# Patient Record
Sex: Male | Born: 1995 | Race: Black or African American | Hispanic: No | Marital: Single | Smoking: Never smoker
Health system: Southern US, Community
[De-identification: ages and names within clinical notes are randomized; demographics above are authoritative.]

---
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
## 2016-10-05 ENCOUNTER — Encounter (HOSPITAL_COMMUNITY): Payer: Self-pay | Admitting: *Deleted

## 2016-10-05 ENCOUNTER — Emergency Department (HOSPITAL_COMMUNITY)
Admission: EM | Admit: 2016-10-05 | Discharge: 2016-10-05 | Disposition: A | Discharge status: Home / Self Care | Payer: Medicaid Other | Attending: Emergency Medicine | Admitting: Emergency Medicine

## 2016-10-05 DIAGNOSIS — Z5321 Procedure and treatment not carried out due to patient leaving prior to being seen by health care provider: Secondary | ICD-10-CM | POA: Insufficient documentation

## 2016-10-05 DIAGNOSIS — R55 Syncope and collapse: Secondary | ICD-10-CM | POA: Insufficient documentation

## 2016-10-05 LAB — BASIC METABOLIC PANEL
ANION GAP: 7 (ref 5–15)
BUN: 19 mg/dL (ref 6–20)
CALCIUM: 9 mg/dL (ref 8.9–10.3)
CO2: 26 mmol/L (ref 22–32)
Chloride: 107 mmol/L (ref 101–111)
Creatinine, Ser: 1.12 mg/dL (ref 0.61–1.24)
GLUCOSE: 126 mg/dL — AB (ref 65–99)
POTASSIUM: 3.4 mmol/L — AB (ref 3.5–5.1)
SODIUM: 140 mmol/L (ref 135–145)

## 2016-10-05 LAB — CBC
HEMATOCRIT: 39.3 % (ref 39.0–52.0)
HEMOGLOBIN: 13.4 g/dL (ref 13.0–17.0)
MCH: 32.8 pg (ref 26.0–34.0)
MCHC: 34.1 g/dL (ref 30.0–36.0)
MCV: 96.3 fL (ref 78.0–100.0)
Platelets: 261 10*3/uL (ref 150–400)
RBC: 4.08 MIL/uL — ABNORMAL LOW (ref 4.22–5.81)
RDW: 11.8 % (ref 11.5–15.5)
WBC: 7.2 10*3/uL (ref 4.0–10.5)

## 2016-10-05 NOTE — ED Notes (Signed)
Pts mother reports pt is leaving, this RN encouraged the pt to stay, pt requesting to be removed
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
## 2017-05-20 ENCOUNTER — Emergency Department (HOSPITAL_COMMUNITY)
Admission: EM | Admit: 2017-05-20 | Discharge: 2017-05-20 | Disposition: A | Discharge status: Home / Self Care | Payer: Self-pay | Attending: Emergency Medicine | Admitting: Emergency Medicine

## 2017-05-20 ENCOUNTER — Other Ambulatory Visit: Payer: Self-pay

## 2017-05-20 ENCOUNTER — Encounter (HOSPITAL_COMMUNITY): Payer: Self-pay | Admitting: Emergency Medicine

## 2017-05-20 DIAGNOSIS — H00015 Hordeolum externum left lower eyelid: Secondary | ICD-10-CM | POA: Insufficient documentation

## 2017-05-20 NOTE — Discharge Instructions (Signed)
You were seen in the emergency department for a stye to your left lower leg.  Please see the attached handout for further information regarding this diagnosis.  Apply warm compresses to the lower lid for 15 minutes 4 times per day.  You may gently massage this area after warm compress application.  Be sure to wash your hands frequently.  This should resolve on its own in the next 1-2 weeks.  If this is not resolved in the next 10 days you will need to follow-up with an ophthalmologist.  We have given you information for ophthalmologist to your discharge instructions.  Return to the ER for any new or worsening symptoms including but not limited to change in your vision, pus like drainage from the eye, difficulty moving her eye, swelling/redness around the eye, fever, or any other concerns that you may have.

## 2017-05-20 NOTE — ED Triage Notes (Signed)
Pt with complaints of stye to left eye. States he had one in his right eye recently.

## 2017-05-20 NOTE — ED Provider Notes (Signed)
[facility] EMERGENCY DEPARTMENT Provider Note   CSN: [id number] Arrival date & time: 05/20/17  1744     History   Chief Complaint Chief Complaint  Patient presents with  . Stye    HPI Jeremy Zhang is a 22 y.o. male without significant past medical history who presents the emergency department with complaint of stye to the left lower lid of his eye that started 2 days ago.  Patient states that the area is somewhat uncomfortable and irritating.  Rates discomfort an 8 out of 10 in severity, no alleviating or aggravating factors.  Specifically states the area of pain is to the lid not to the eye.  He has been applying warm compresses.  Denies change in vision or eye drainage, fever, or chills.  HPI  History reviewed. No pertinent past medical history.  There are no active problems to display for this patient.   History reviewed. No pertinent surgical history.      Home Medications    Prior to Admission medications   Medication Sig Start Date End Date Taking? Authorizing Provider  cyclobenzaprine (FLEXERIL) 5 MG tablet Take 1 tablet (5 mg total) by mouth once. Patient not taking: Reported on 01/26/2014 10/05/13   Niel Hummer, MD  diphenhydrAMINE (BENADRYL) 25 mg capsule Take 1 capsule (25 mg total) by mouth every 8 (eight) hours as needed for itching or allergies. 01/26/14   Earley Favor, NP    Family History History reviewed. No pertinent family history.  Social History Social History   Tobacco Use  . Smoking status: Never Smoker  Substance Use Topics  . Alcohol use: No  . Drug use: Not on file     Allergies   Patient has no known allergies.   Review of Systems Review of Systems  Constitutional: Negative for chills and fever.  Eyes: Negative for photophobia, pain, discharge and visual disturbance.       Positive for stye to left lower lid.   Physical Exam Updated Vital Signs BP 114/70   Pulse (!) 54   Temp 98 F (36.7 C) (Oral)    Resp 16   Ht 5\' 8"  (1.727 m)   Wt 56.7 kg (125 lb)   SpO2 100%   BMI 19.01 kg/m   Physical Exam  Constitutional: He appears well-developed and well-nourished. No distress.  HENT:  Head: Normocephalic and atraumatic.  Eyes: Pupils are equal, round, and reactive to light. Conjunctivae and EOM are normal. Lids are everted and swept, no foreign bodies found. Right eye exhibits no discharge. Left eye exhibits no discharge.    No periorbital swelling or erythema.  No overlying warmth.  Neurological: He is alert.  Clear speech.   Psychiatric: He has a normal mood and affect. His behavior is normal. Thought content normal.  Nursing note and vitals reviewed.  ED Treatments / Results  Labs (all labs ordered are listed, but only abnormal results are displayed) Labs Reviewed - No data to display  EKG None  Radiology No results found.  Procedures Procedures (including critical care time)  Medications Ordered in ED Medications - No data to display   Initial Impression / Assessment and Plan / ED Course  I have reviewed the triage vital signs and the nursing notes.  Pertinent labs & imaging results that were available during my care of the patient were reviewed by me and considered in my medical decision making (see chart for details).  Patient presents with left lower lid area of erythema, tenderness,  and mild swelling without injection of the cornea/conjunctiva consistent with external stye.  No purulent discharge entrapment, consensual photophobia, or periorbital swelling/erythema to indicate orbital/periorbital cellulitis.  Given onset of symptoms 2 days prior will recommend warm compresses, personal hygiene and frequent handwashing also discussed.  Patient advised to followup with ophthalmologist if sxs have not resolved in 1-2 weeks. Given strict ED return precautions. Provided opportunity for questions, patient in agreement with plan.   Final Clinical Impressions(s) / ED Diagnoses    Final diagnoses:  Hordeolum externum of left lower eyelid    ED Discharge Orders    None       Cherly Andersonetrucelli, Georgianna Band R, [name] 05/20/17 1956    Margarita Grizzleay, Danielle, MD 05/21/17 1431

## 2018-02-25 ENCOUNTER — Emergency Department (HOSPITAL_COMMUNITY)
Admission: EM | Admit: 2018-02-25 | Discharge: 2018-02-25 | Disposition: A | Discharge status: Home / Self Care | Payer: Self-pay | Attending: Emergency Medicine | Admitting: Emergency Medicine

## 2018-02-25 ENCOUNTER — Encounter (HOSPITAL_COMMUNITY): Payer: Self-pay

## 2018-02-25 ENCOUNTER — Other Ambulatory Visit: Payer: Self-pay

## 2018-02-25 DIAGNOSIS — M545 Low back pain, unspecified: Secondary | ICD-10-CM

## 2018-02-25 MED ORDER — LIDOCAINE 5 % EX PTCH: 1.0000 | MEDICATED_PATCH | CUTANEOUS | 0 refills | Status: DC

## 2018-02-25 MED ORDER — IBUPROFEN 600 MG PO TABS: 600.0000 mg | ORAL_TABLET | Freq: Four times a day (QID) | ORAL | 0 refills | Status: DC | PRN

## 2018-02-25 MED ORDER — METHOCARBAMOL 500 MG PO TABS: 500.0000 mg | ORAL_TABLET | Freq: Two times a day (BID) | ORAL | 0 refills | Status: DC

## 2018-02-25 NOTE — ED Provider Notes (Signed)
[facility] EMERGENCY DEPARTMENT Provider Note   CSN: [id number] Arrival date & time: 02/25/18  1450     History   Chief Complaint Chief Complaint  Patient presents with  . Back Pain    HPI Jeremy Zhang is a 23 y.o. male.  HPI   Jeremy Zhang is a 23 y.o. male, with a history of back pain, presenting to the ED with left lower back pain beginning today.  States he has had intermittent lower back pain since his teens.  Today while at work he lifted and twisted with pain beginning in the left lower back, described as a soreness and tightness, moderate, nonradiating.  He has not tried any therapies for his complaint.  Denies falls/trauma, N/V/D, changes in bowel or bladder function, numbness, weakness, saddle anesthesias, or any other complaints.  History reviewed. No pertinent past medical history.  There are no active problems to display for this patient.   History reviewed. No pertinent surgical history.      Home Medications    Prior to Admission medications   Medication Sig Start Date End Date Taking? Authorizing Provider  diphenhydrAMINE (BENADRYL) 25 mg capsule Take 1 capsule (25 mg total) by mouth every 8 (eight) hours as needed for itching or allergies. 01/26/14   Earley Favor, NP  ibuprofen (ADVIL,MOTRIN) 600 MG tablet Take 1 tablet (600 mg total) by mouth every 6 (six) hours as needed. 02/25/18   Joy, Shawn C, [name]  lidocaine (LIDODERM) 5 % Place 1 patch onto the skin daily. Remove & Discard patch within 12 hours or as directed by MD 02/25/18   Joy, Shawn C, [name]  methocarbamol (ROBAXIN) 500 MG tablet Take 1 tablet (500 mg total) by mouth 2 (two) times daily. 02/25/18   Anselm Pancoast, [name]    Family History History reviewed. No pertinent family history.  Social History Social History   Tobacco Use  . Smoking status: Never Smoker  . Smokeless tobacco: Never Used  Substance Use Topics  . Alcohol use: No  . Drug use: Not on file      Allergies   Patient has no known allergies.   Review of Systems Review of Systems  Gastrointestinal: Negative for abdominal pain, diarrhea, nausea and vomiting.  Genitourinary: Negative for difficulty urinating.  Musculoskeletal: Positive for back pain.  Neurological: Negative for weakness and numbness.     Physical Exam Updated Vital Signs BP 132/77   Pulse (!) 56   Temp 98.6 F (37 C) (Oral)   Resp 16   Ht 5\' 9"  (1.753 m)   Wt 56.7 kg   SpO2 100%   BMI 18.46 kg/m   Physical Exam Vitals signs and nursing note reviewed.  Constitutional:      General: He is not in acute distress.    Appearance: He is well-developed. He is not diaphoretic.  HENT:     Head: Normocephalic and atraumatic.     Mouth/Throat:     Mouth: Mucous membranes are moist.     Pharynx: Oropharynx is clear.  Eyes:     Conjunctiva/sclera: Conjunctivae normal.  Neck:     Musculoskeletal: Neck supple.  Cardiovascular:     Rate and Rhythm: Normal rate and regular rhythm.     Pulses: Normal pulses.          Posterior tibial pulses are 2+ on the right side and 2+ on the left side.  Pulmonary:     Effort: Pulmonary effort is normal. No respiratory distress.  Abdominal:     Palpations: Abdomen is soft.     Tenderness: There is no abdominal tenderness. There is no guarding.  Musculoskeletal:       Back:     Right lower leg: No edema.     Left lower leg: No edema.     Comments: Normal motor function intact in all extremities. No midline spinal tenderness.   Lymphadenopathy:     Cervical: No cervical adenopathy.  Skin:    General: Skin is warm and dry.  Neurological:     Mental Status: He is alert.     Comments: Sensation grossly intact to light touch in the lower extremities bilaterally. No saddle anesthesias. Strength 5/5 in the bilateral lower extremities. No noted gait deficit.  Psychiatric:        Mood and Affect: Mood and affect normal.        Speech: Speech normal.         Behavior: Behavior normal.      ED Treatments / Results  Labs (all labs ordered are listed, but only abnormal results are displayed) Labs Reviewed - No data to display  EKG None  Radiology No results found.  Procedures Procedures (including critical care time)  Medications Ordered in ED Medications - No data to display   Initial Impression / Assessment and Plan / ED Course  I have reviewed the triage vital signs and the nursing notes.  Pertinent labs & imaging results that were available during my care of the patient were reviewed by me and considered in my medical decision making (see chart for details).     Patient presents with left lower back pain.  No red flag symptoms.  No focal neuro deficits.  Mechanism and physical exam support suspected diagnosis of muscle strain. The patient was given instructions for home care as well as return precautions. Patient voices understanding of these instructions, accepts the plan, and is comfortable with discharge.  Final Clinical Impressions(s) / ED Diagnoses   Final diagnoses:  Acute left-sided low back pain without sciatica    ED Discharge Orders         Ordered    ibuprofen (ADVIL,MOTRIN) 600 MG tablet  Every 6 hours PRN     02/25/18 1543    methocarbamol (ROBAXIN) 500 MG tablet  2 times daily     02/25/18 1543    lidocaine (LIDODERM) 5 %  Every 24 hours     02/25/18 1543           Anselm PancoastJoy, Shawn C, [name] 02/25/18 1550    Tilden Fossaees, Elizabeth, MD 02/25/18 [phone]

## 2018-02-25 NOTE — Discharge Instructions (Signed)
Expect your soreness to increase over the next 2-3 days. Take it easy, but do not lay around too much as this may make any stiffness worse.  °Antiinflammatory medications: Take 600 mg of ibuprofen every 6 hours or 440 mg (over the counter dose) to 500 mg (prescription dose) of naproxen every 12 hours for the next 3 days. After this time, these medications may be used as needed for pain. Take these medications with food to avoid upset stomach. Choose only one of these medications, do not take them together. °Acetaminophen (generic for Tylenol): Should you continue to have additional pain while taking the ibuprofen or naproxen, you may add in acetaminophen as needed. Your daily total maximum amount of acetaminophen from all sources should be limited to 4000mg/day for persons without liver problems, or 2000mg/day for those with liver problems. °Muscle relaxer: Robaxin is a muscle relaxer and may help loosen stiff muscles. Do not take the Robaxin while driving or performing other dangerous activities.  °Lidocaine patches: These are available via either prescription or over-the-counter. The over-the-counter option may be more economical one and are likely just as effective. There are multiple over-the-counter brands, such as Salonpas. °Exercises: Be sure to perform the attached exercises starting with three times a week and working up to performing them daily. This is an essential part of preventing long term problems.  °Follow up: Follow up with a primary care provider for any future management of these complaints. Be sure to follow up within 7-10 days. °Return: Return to the ED should symptoms worsen. ° °For prescription assistance, may try using prescription discount sites or apps, such as goodrx.com °

## 2018-02-25 NOTE — ED Notes (Signed)
Patient verbalizes understanding of discharge instructions. Opportunity for questioning and answers were provided. Armband removed by staff, pt discharged from ED. Pt ambulatory to lobby.  

## 2018-02-25 NOTE — ED Triage Notes (Signed)
Pt endorses back pain since he was 23 y/o. Pt reports he was at work this morning when he lifted something heavy and it worsened his back pain. Pt endorses it is lower and 7/10.

## 2018-02-28 ENCOUNTER — Emergency Department (HOSPITAL_COMMUNITY): Admission: EM | Admit: 2018-02-28 | Payer: Self-pay | Source: Home / Self Care

## 2018-05-13 ENCOUNTER — Encounter (HOSPITAL_COMMUNITY): Payer: Self-pay | Admitting: Emergency Medicine

## 2018-05-13 ENCOUNTER — Emergency Department (HOSPITAL_COMMUNITY)
Admission: EM | Admit: 2018-05-13 | Discharge: 2018-05-13 | Disposition: A | Discharge status: Home / Self Care | Payer: Self-pay | Attending: Emergency Medicine | Admitting: Emergency Medicine

## 2018-05-13 ENCOUNTER — Other Ambulatory Visit: Payer: Self-pay

## 2018-05-13 DIAGNOSIS — M7918 Myalgia, other site: Secondary | ICD-10-CM | POA: Insufficient documentation

## 2018-05-13 DIAGNOSIS — R432 Parageusia: Secondary | ICD-10-CM | POA: Insufficient documentation

## 2018-05-13 NOTE — ED Provider Notes (Signed)
[facility] EMERGENCY DEPARTMENT Provider Note   CSN: [id number] Arrival date & time: 05/13/18  2112    History   Chief Complaint Chief Complaint  Patient presents with  . "can't taste food"    HPI Jeremy Zhang is a 23 y.o. male with no significant past medical history who presents for evaluation of difficulty tasting his food that began today.  Patient states that he was eating dinner and states that he noticed when he tried to eat the noodles, he cannot taste it.  He reports trying several different things, including hot sauce but states he was not able to taste it.  Patient states that he attended a party yesterday in [place].  He does not know if anybody was sick.  Patient states that he has not had any difficulty breathing, cough, chest pain, nausea/vomiting, diarrhea.  He states he has had some generalized myalgias.  He states he has not measured any fever.  Patient denies any vision changes, chest pain, numbness/weakness of his arms or legs, difficulty walking.     The history is provided by the patient.    History reviewed. No pertinent past medical history.  There are no active problems to display for this patient.   History reviewed. No pertinent surgical history.      Home Medications    Prior to Admission medications   Medication Sig Start Date End Date Taking? Authorizing Provider  diphenhydrAMINE (BENADRYL) 25 mg capsule Take 1 capsule (25 mg total) by mouth every 8 (eight) hours as needed for itching or allergies. 01/26/14   Earley Favor, NP  ibuprofen (ADVIL,MOTRIN) 600 MG tablet Take 1 tablet (600 mg total) by mouth every 6 (six) hours as needed. 02/25/18   Joy, Shawn C, [name]  lidocaine (LIDODERM) 5 % Place 1 patch onto the skin daily. Remove & Discard patch within 12 hours or as directed by MD 02/25/18   Joy, Shawn C, [name]  methocarbamol (ROBAXIN) 500 MG tablet Take 1 tablet (500 mg total) by mouth 2 (two) times daily. 02/25/18   Joy,  Hillard Danker, [name]    Family History No family history on file.  Social History Social History   Tobacco Use  . Smoking status: Never Smoker  . Smokeless tobacco: Never Used  Substance Use Topics  . Alcohol use: No  . Drug use: Not on file     Allergies   Patient has no known allergies.   Review of Systems Review of Systems  Constitutional: Negative for fever.  HENT:       Difficulty tasting  Respiratory: Negative for cough and shortness of breath.   Cardiovascular: Negative for chest pain.  Gastrointestinal: Negative for abdominal pain, nausea and vomiting.  Genitourinary: Negative for dysuria and hematuria.  Musculoskeletal: Positive for myalgias.  Neurological: Negative for weakness, numbness and headaches.  All other systems reviewed and are negative.    Physical Exam Updated Vital Signs BP 127/73 (BP Location: Right Arm)   Pulse 75   Temp 98.1 F (36.7 C) (Oral)   Resp 16   Ht 5\' 8"  (1.727 m)   Wt 56.7 kg   SpO2 98%   BMI 19.01 kg/m   Physical Exam Vitals signs and nursing note reviewed.  Constitutional:      Appearance: Normal appearance. He is well-developed.  HENT:     Head: Normocephalic and atraumatic.     Mouth/Throat:     Pharynx: Oropharynx is clear.  Eyes:     General: Lids  are normal.     Conjunctiva/sclera: Conjunctivae normal.     Pupils: Pupils are equal, round, and reactive to light.  Neck:     Musculoskeletal: Full passive range of motion without pain.  Cardiovascular:     Rate and Rhythm: Normal rate and regular rhythm.     Pulses: Normal pulses.     Heart sounds: Normal heart sounds. No murmur. No friction rub. No gallop.   Pulmonary:     Effort: Pulmonary effort is normal.     Breath sounds: Normal breath sounds.     Comments: Lungs clear to auscultation bilaterally.  Symmetric chest rise.  No wheezing, rales, rhonchi. Abdominal:     Palpations: Abdomen is soft. Abdomen is not rigid.     Tenderness: There is no abdominal  tenderness. There is no guarding.  Musculoskeletal: Normal range of motion.  Skin:    General: Skin is warm and dry.     Capillary Refill: Capillary refill takes less than 2 seconds.  Neurological:     Mental Status: He is alert and oriented to person, place, and time.     Comments: Cranial nerves III-XII intact Follows commands, Moves all extremities  5/5 strength to BUE and BLE  Sensation intact throughout all major nerve distributions Normal finger to nose. No dysdiadochokinesia. No pronator drift. No gait abnormalities  No slurred speech. No facial droop.   Psychiatric:        Speech: Speech normal.      ED Treatments / Results  Labs (all labs ordered are listed, but only abnormal results are displayed) Labs Reviewed - No data to display  EKG None  Radiology No results found.  Procedures Procedures (including critical care time)  Medications Ordered in ED Medications - No data to display   Initial Impression / Assessment and Plan / ED Course  I have reviewed the triage vital signs and the nursing notes.  Pertinent labs & imaging results that were available during my care of the patient were reviewed by me and considered in my medical decision making (see chart for details).        23 year old male who difficulty taking stating that began today.  No fevers, chest pain, difficulty breathing, cough.  He reports attending a party yesterday. Patient is afebrile, non-toxic appearing, sitting comfortably on examination table.  Vital signs reviewed and stable. No neuro deficits noted on exam. Lungs CTAB. No evidence of respiratory distress.  At this time, patient does not meet any criteria for testing for COVID-19.  I did discuss with him that lack of taste and smell can be a presenting for COVID-19.  Instructed patient to self quarantine for 14 days. At this time, patient exhibits no emergent life-threatening condition that require further evaluation in ED or admission.  Patient had ample opportunity for questions and discussion. All patient's questions were answered with full understanding. Strict return precautions discussed. Patient expresses understanding and agreement to plan.   Jeremy Zhang was evaluated in Emergency Department on 05/13/2018 for the symptoms described in the history of present illness. He was evaluated in the context of the global COVID-19 pandemic, which necessitated consideration that the patient might be at risk for infection with the SARS-CoV-2 virus that causes COVID-19. Institutional protocols and algorithms that pertain to the evaluation of patients at risk for COVID-19 are in a state of rapid change based on information released by regulatory bodies including the CDC and federal and state organizations. These policies and algorithms were followed during  the patient's care in the ED.  Portions of this note were generated with Scientist, clinical (histocompatibility and immunogenetics)Dragon dictation software. Dictation errors may occur despite best attempts at proofreading.   Final Clinical Impressions(s) / ED Diagnoses   Final diagnoses:  Abnormal sense of taste    ED Discharge Orders    None       Rosana HoesLayden, Mireya Meditz A, [name] 05/13/18 2215    Alvira MondaySchlossman, Erin, MD 05/17/18 2029

## 2018-05-13 NOTE — ED Triage Notes (Signed)
Pt reports he "can't taste my food", states he "even tried hot sauce but can't taste that."  Denies any other symptoms such as N/V/D, SOB, cough, weakness.  "I can't get full if I can't taste my food."

## 2018-05-13 NOTE — Discharge Instructions (Signed)
As we discussed, you should self quarantine for 14 days.  Return to the emergency department if you are having trouble breathing.  Coronavirus (COVID-19) Are you at risk?  Are you at risk for the Coronavirus (COVID-19)?  To be considered HIGH RISK for Coronavirus (COVID-19), you have to meet the following criteria:   Traveled to [place], [place], [place], [place] or [place]; or in the [place] to [place], [place], [place], or [place]; and have fever, cough, and shortness of breath within the last 2 weeks of travel OR  Been in close contact with a person diagnosed with COVID-19 within the last 2 weeks and have fever, cough, and shortness of breath  IF YOU DO NOT MEET THESE CRITERIA, YOU ARE CONSIDERED LOW RISK FOR COVID-19.  What to do if you are HIGH RISK for COVID-19?   If you are having a medical emergency, call 911.  Seek medical care right away. Before you go to a doctors office, urgent care or emergency department, call ahead and tell them about your recent travel, contact with someone diagnosed with COVID-19, and your symptoms. You should receive instructions from your physicians office regarding next steps of care.   When you arrive at healthcare provider, tell the healthcare staff immediately you have returned from visiting [place], [place], [place], [place] or [place]; or traveled in the [place] to [place], [place], [place], or [place]; in the last two weeks or you have been in close contact with a person diagnosed with COVID-19 in the last 2 weeks.    Tell the health care staff about your symptoms: fever, cough and shortness of breath.  After you have been seen by a medical provider, you will be either: o Tested for (COVID-19) and discharged home on quarantine except to seek medical care if symptoms worsen, and asked to  - Stay home and avoid contact with others until you get your results (4-5 days)  - Avoid travel on public transportation if possible  (such as bus, train, or airplane) or o Sent to the Emergency Department by EMS for evaluation, COVID-19 testing, and possible admission depending on your condition and test results.  What to do if you are LOW RISK for COVID-19?  Reduce your risk of any infection by using the same precautions used for avoiding the common cold or flu:   Wash your hands often with soap and warm water for at least 20 seconds.  If soap and water are not readily available, use an alcohol-based hand sanitizer with at least 60% alcohol.   If coughing or sneezing, cover your mouth and nose by coughing or sneezing into the elbow areas of your shirt or coat, into a tissue or into your sleeve (not your hands).  Avoid shaking hands with others and consider head nods or verbal greetings only.  Avoid touching your eyes, nose, or mouth with unwashed hands.   Avoid close contact with people who are sick.  Avoid places or events with large numbers of people in one location, like concerts or sporting events.  Carefully consider travel plans you have or are making.  If you are planning any travel outside or inside the [place], visit the [facility] [employer] [facility] webpage for the latest health notices.  If you have some symptoms but not all symptoms, continue to monitor at home and seek medical attention if your symptoms worsen.  If you are having a medical emergency, call 911.   ADDITIONAL HEALTHCARE OPTIONS FOR PATIENTS  [facility] / e-Visit: [email or web address]         [facility]: [phone]  [facility]: [phone]                   [facility]: [phone]

## 2018-10-21 ENCOUNTER — Emergency Department (HOSPITAL_COMMUNITY)
Admission: EM | Admit: 2018-10-21 | Discharge: 2018-10-22 | Discharge status: Against Medical Advice | Payer: Self-pay | Attending: Emergency Medicine | Admitting: Emergency Medicine

## 2018-10-21 ENCOUNTER — Other Ambulatory Visit: Payer: Self-pay

## 2018-10-21 ENCOUNTER — Encounter (HOSPITAL_COMMUNITY): Payer: Self-pay | Admitting: *Deleted

## 2018-10-21 DIAGNOSIS — Z5321 Procedure and treatment not carried out due to patient leaving prior to being seen by health care provider: Secondary | ICD-10-CM | POA: Insufficient documentation

## 2018-10-21 MED ORDER — SODIUM CHLORIDE 0.9% FLUSH: 3.0000 mL | Freq: Once | INTRAVENOUS | Status: DC

## 2018-10-21 NOTE — ED Notes (Signed)
This NT attempted to draw blood work on pt however pt would not cooperate and stated "Man you're waking me up and I am already irritated" This NT explained that we need this blood work to help [place] better understand what could be the source of his pain. Pt then stated "Im just going to walk out" Pt then walked out of the ED. Gabriel Cirri, RN has been notified.

## 2018-10-21 NOTE — ED Notes (Signed)
Pt would not sit still for blood and told the ED Tech he was leaving.  Pt said he was leaving

## 2018-10-21 NOTE — ED Triage Notes (Signed)
To ED for eval vomiting since approx 1130 last pm. Pt states he started drinking 'absolute' around 9:30pm. No diarrhea.

## 2018-10-21 NOTE — ED Notes (Signed)
Jeremy Zhang [phone] wants update and call when she can come back with patient

## 2018-11-08 ENCOUNTER — Other Ambulatory Visit: Payer: Self-pay

## 2018-11-08 ENCOUNTER — Encounter (HOSPITAL_COMMUNITY): Payer: Self-pay

## 2018-11-08 ENCOUNTER — Emergency Department (HOSPITAL_COMMUNITY)
Admission: EM | Admit: 2018-11-08 | Discharge: 2018-11-08 | Disposition: A | Discharge status: Home / Self Care | Payer: Self-pay | Attending: Emergency Medicine | Admitting: Emergency Medicine

## 2018-11-08 DIAGNOSIS — G8929 Other chronic pain: Secondary | ICD-10-CM | POA: Insufficient documentation

## 2018-11-08 DIAGNOSIS — Z79899 Other long term (current) drug therapy: Secondary | ICD-10-CM | POA: Insufficient documentation

## 2018-11-08 DIAGNOSIS — M545 Low back pain: Secondary | ICD-10-CM | POA: Insufficient documentation

## 2018-11-08 MED ORDER — CYCLOBENZAPRINE HCL 10 MG PO TABS: 10.0000 mg | ORAL_TABLET | Freq: Two times a day (BID) | ORAL | 0 refills | Status: DC | PRN

## 2018-11-08 NOTE — ED Provider Notes (Signed)
[facility] EMERGENCY DEPARTMENT Provider Note   CSN: [id number] Arrival date & time: 11/08/18  4235     History   Chief Complaint Chief Complaint  Patient presents with  . Back Pain    HPI Jeremy Zhang is a 23 y.o. male presenting the emergency department with complaint of chronic bilateral low back pain that has been bothering him intermittently for an extended period of time.  He states he is not currently having pain though yesterday was having sharp pains in his low back.  His symptoms are improved with heat and over-the-counter medications.  He denies any new injuries.  No abdominal pain, fever, bowel or bladder incontinence, radiating pain down the legs, numbness or weakness in extremities, or urinary symptoms.  He has not established primary care nor followed up for his ongoing symptoms, he just reports to the emergency department when his symptoms get worse.     The history is provided by the patient.    History reviewed. No pertinent past medical history.  There are no active problems to display for this patient.   History reviewed. No pertinent surgical history.      Home Medications    Prior to Admission medications   Medication Sig Start Date End Date Taking? Authorizing Provider  cyclobenzaprine (FLEXERIL) 10 MG tablet Take 1 tablet (10 mg total) by mouth 2 (two) times daily as needed for muscle spasms. 11/08/18   Robinson, [place] N, [name]  diphenhydrAMINE (BENADRYL) 25 mg capsule Take 1 capsule (25 mg total) by mouth every 8 (eight) hours as needed for itching or allergies. 01/26/14   Junius Creamer, NP  ibuprofen (ADVIL,MOTRIN) 600 MG tablet Take 1 tablet (600 mg total) by mouth every 6 (six) hours as needed. 02/25/18   Joy, Shawn C, [name]  lidocaine (LIDODERM) 5 % Place 1 patch onto the skin daily. Remove & Discard patch within 12 hours or as directed by MD 02/25/18   Joy, Shawn C, [name]  methocarbamol (ROBAXIN) 500 MG tablet Take 1 tablet (500  mg total) by mouth 2 (two) times daily. 02/25/18   Joy, Helane Gunther, [name]    Family History No family history on file.  Social History Social History   Tobacco Use  . Smoking status: Never Smoker  . Smokeless tobacco: Never Used  Substance Use Topics  . Alcohol use: No  . Drug use: Not on file     Allergies   Patient has no known allergies.   Review of Systems Review of Systems  Constitutional: Negative for fever.  Musculoskeletal: Positive for back pain.     Physical Exam Updated Vital Signs BP 128/68   Pulse (!) 56   Temp 97.9 F (36.6 C) (Oral)   Resp 18   SpO2 100%   Physical Exam Vitals signs and nursing note reviewed.  Constitutional:      General: He is not in acute distress.    Appearance: He is well-developed.  HENT:     Head: Normocephalic and atraumatic.  Eyes:     Conjunctiva/sclera: Conjunctivae normal.  Cardiovascular:     Rate and Rhythm: Normal rate and regular rhythm.  Pulmonary:     Effort: Pulmonary effort is normal.  Musculoskeletal:     Comments: Tenderness to bilateral lumbar musculature, no midline tenderness, no bony step-offs or gross deformities.  Moving around the bed without difficulty.  Neurological:     Mental Status: He is alert.     Comments: Normal tone.  5/5 strength  in BUE and BLE including strong and equal grip strength and dorsiflexion/plantar flexion Sensory: Pinprick and light touch normal in BLE extremities.  Gait: normal gait and balance CV: distal pulses palpable throughout    Psychiatric:        Mood and Affect: Mood normal.        Behavior: Behavior normal.      ED Treatments / Results  Labs (all labs ordered are listed, but only abnormal results are displayed) Labs Reviewed - No data to display  EKG None  Radiology No results found.  Procedures Procedures (including critical care time)  Medications Ordered in ED Medications - No data to display   Initial Impression / Assessment and Plan / ED  Course  I have reviewed the triage vital signs and the nursing notes.  Pertinent labs & imaging results that were available during my care of the patient were reviewed by me and considered in my medical decision making (see chart for details).       Patient with chronic intermittent low back pain.  No neurological deficits and normal neuro exam.  No difficulty with ambulation.  No loss of bowel or bladder control.  No concern for cauda equina.  No fevers.  Encouraged establish PCP, referral provided.  RICE protocol and OTC pain medicine indicated and discussed with patient.   Discussed results, findings, treatment and follow up. Patient advised of return precautions. Patient verbalized understanding and agreed with plan.  Final Clinical Impressions(s) / ED Diagnoses   Final diagnoses:  Chronic bilateral low back pain without sciatica    ED Discharge Orders         Ordered    cyclobenzaprine (FLEXERIL) 10 MG tablet  2 times daily PRN     11/08/18 1039           Robinson, [place] N, [place] 11/08/18 1043    Gerhard Munch, MD 11/08/18 1553

## 2018-11-08 NOTE — ED Triage Notes (Signed)
Patient complains of ongoing back pain for years, pain with ROM. Ambulatory to room

## 2018-11-08 NOTE — Discharge Instructions (Signed)
Please read instructions below.  You can take 600 mg of ibuprofen every 6 hours as needed for pain.   Apply ice to your back for 20 minutes at a time.  You can also apply heat if this provides more relief.   You can take Flexeril/cyclobenzaprine every 12 hours as needed for muscle spasm.  Be aware this medication can make you drowsy; do not take while driving or drinking alcohol.   Establish primary care to follow-up on your ongoing symptoms. Return to ER if new numbness or tingling in your arms or legs, inability to urinate, inability to hold your bowels, or weakness in your extremities.

## 2019-01-13 ENCOUNTER — Emergency Department (HOSPITAL_COMMUNITY): Payer: Self-pay

## 2019-01-13 ENCOUNTER — Other Ambulatory Visit: Payer: Self-pay

## 2019-01-13 ENCOUNTER — Emergency Department (HOSPITAL_COMMUNITY)
Admission: EM | Admit: 2019-01-13 | Discharge: 2019-01-13 | Disposition: A | Discharge status: Home / Self Care | Payer: Self-pay | Attending: Emergency Medicine | Admitting: Emergency Medicine

## 2019-01-13 ENCOUNTER — Encounter (HOSPITAL_COMMUNITY): Payer: Self-pay

## 2019-01-13 DIAGNOSIS — M79641 Pain in right hand: Secondary | ICD-10-CM | POA: Insufficient documentation

## 2019-01-13 DIAGNOSIS — Z79899 Other long term (current) drug therapy: Secondary | ICD-10-CM | POA: Insufficient documentation

## 2019-01-13 NOTE — Discharge Instructions (Signed)

## 2019-01-13 NOTE — ED Notes (Signed)
Patient transported to X-ray 

## 2019-01-13 NOTE — ED Triage Notes (Signed)
Patient complains of right hand pain that started today at work. 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

## 2019-01-13 NOTE — ED Provider Notes (Signed)
[facility] EMERGENCY DEPARTMENT Provider Note   CSN: [id number] Arrival date & time: 01/13/19  0962     History   Chief Complaint No chief complaint on file.   HPI Jeremy Zhang is a 23 y.o. male right-hand-dominant who presents today for evaluation of right lateral hand pain.  In 2014 he broke his right fifth metacarpal.  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  Over the past week he has had worsening pain in the right lateral hand.  He denies any specific injury.  He has not attempted anything prior to arrival.  He denies any fevers or any other symptoms.  He denies any recent trauma.     HPI  History reviewed. No pertinent past medical history.  There are no active problems to display for this patient.   History reviewed. No pertinent surgical history.      Home Medications    Prior to Admission medications   Medication Sig Start Date End Date Taking? Authorizing Provider  cyclobenzaprine (FLEXERIL) 10 MG tablet Take 1 tablet (10 mg total) by mouth 2 (two) times daily as needed for muscle spasms. 11/08/18   Robinson, [place] N, [name]  diphenhydrAMINE (BENADRYL) 25 mg capsule Take 1 capsule (25 mg total) by mouth every 8 (eight) hours as needed for itching or allergies. 01/26/14   Junius Creamer, NP  ibuprofen (ADVIL,MOTRIN) 600 MG tablet Take 1 tablet (600 mg total) by mouth every 6 (six) hours as needed. 02/25/18   Joy, Shawn C, [name]  lidocaine (LIDODERM) 5 % Place 1 patch onto the skin daily. Remove & Discard patch within 12 hours or as directed by MD 02/25/18   Joy, Shawn C, [name]  methocarbamol (ROBAXIN) 500 MG tablet Take 1 tablet (500 mg total) by mouth 2 (two) times daily. 02/25/18   Joy, Helane Gunther, [name]    Family History No family history on file.  Social History Social History   Tobacco Use   Smoking status: Never Smoker   Smokeless tobacco: Never Used  Substance Use Topics   Alcohol use: No    Drug use: Not on file     Allergies   Patient has no known allergies.   Review of Systems Review of Systems  Constitutional: Negative for chills and fever.  Skin: Negative for color change, rash and wound.  All other systems reviewed and are negative.    Physical Exam Updated Vital Signs BP 112/61 (BP Location: Right Arm)    Pulse 63    Temp 98.4 F (36.9 C) (Oral)    Resp 13    SpO2 100%   Physical Exam Vitals signs and nursing note reviewed.  Constitutional:      General: He is not in acute distress. Cardiovascular:     Comments: Capillary refill to fingers on right hand under 2 seconds.  Right hand is normally warm. Musculoskeletal:     Comments: Right hand with no crepitus or deformities palpated.  He is able to make a loose fist, however when attempting to flex his fingers against resistance he has increased pain.  No significant edema.  Skin:    Comments: No abnormal erythema, edema or ecchymosis present on the right hand.  Specifically there are no visible wounds over the MCP joints on the right hand.  Neurological:     Mental Status: He is alert.     Comments: Sensation intact to fingers on right hand to light touch.  ED Treatments / Results  Labs (all labs ordered are listed, but only abnormal results are displayed) Labs Reviewed - No data to display  EKG None  Radiology Dg Hand Complete Right  Result Date: 01/13/2019 CLINICAL DATA:  Right hand pain, no acute trauma, history of injury late to punching in 2018 EXAM: RIGHT HAND - COMPLETE 3+ VIEW COMPARISON:  01/20/2013 FINDINGS: No acute fracture or dislocation of the right hand. There is a chronic, mildly angulated fracture deformity of the right fifth metacarpal. Joint spaces are well preserved. Soft tissues are unremarkable. IMPRESSION: No acute fracture or dislocation of the right hand. There is a chronic, mildly angulated fracture deformity of the right fifth metacarpal. Electronically Signed   By:  Lauralyn Primes M.D.   On: 01/13/2019 09:56    Procedures Procedures (including critical care time)  Medications Ordered in ED Medications - No data to display   Initial Impression / Assessment and Plan / ED Course  I have reviewed the triage vital signs and the nursing notes.  Pertinent labs & imaging results that were available during my care of the patient were reviewed by me and considered in my medical decision making (see chart for details).       Patient presents today for evaluation of right hand pain.  He has a remote injury where he previously fractured his right fifth metacarpal.  He is right-hand dominant.  Physical exam without evidence of infection, laceration, or wound and he denies any recent trauma or specific injury.  X-rays show a chronic, mildly angulated fracture deformity of the right fifth metacarpal without acute fracture or dislocation.  Recommended OTC pain medications, conservative care.  ACE wrap placed by myself while patient was in the department.  Neurovascular function without change after placement.  Return precautions were discussed with patient who states their understanding.  At the time of discharge patient denied any unaddressed complaints or concerns.  Patient is agreeable for discharge home.   Final Clinical Impressions(s) / ED Diagnoses   Final diagnoses:  Right hand pain    ED Discharge Orders    None       Norman Clay 01/13/19 1015    Tegeler, Canary Brim, MD 01/13/19 [phone]

## 2019-01-13 NOTE — ED Notes (Signed)
Patient verbalizes understanding of discharge instructions. Opportunity for questioning and answers were provided. Armband removed by staff, pt discharged from ED.  

## 2019-01-28 ENCOUNTER — Ambulatory Visit: Payer: HRSA Program | Attending: Internal Medicine

## 2019-01-28 DIAGNOSIS — Z20828 Contact with and (suspected) exposure to other viral communicable diseases: Secondary | ICD-10-CM | POA: Insufficient documentation

## 2019-01-28 DIAGNOSIS — R238 Other skin changes: Secondary | ICD-10-CM | POA: Insufficient documentation

## 2019-01-28 DIAGNOSIS — U071 COVID-19: Secondary | ICD-10-CM

## 2019-01-29 LAB — NOVEL CORONAVIRUS, NAA: SARS-CoV-2, NAA: NOT DETECTED

## 2019-02-13 ENCOUNTER — Emergency Department (HOSPITAL_COMMUNITY)
Admission: EM | Admit: 2019-02-13 | Discharge: 2019-02-13 | Disposition: A | Discharge status: Home / Self Care | Payer: Self-pay | Attending: Emergency Medicine | Admitting: Emergency Medicine

## 2019-02-13 ENCOUNTER — Encounter (HOSPITAL_COMMUNITY): Payer: Self-pay | Admitting: *Deleted

## 2019-02-13 DIAGNOSIS — R369 Urethral discharge, unspecified: Secondary | ICD-10-CM

## 2019-02-13 DIAGNOSIS — Z711 Person with feared health complaint in whom no diagnosis is made: Secondary | ICD-10-CM

## 2019-02-13 DIAGNOSIS — Z79899 Other long term (current) drug therapy: Secondary | ICD-10-CM | POA: Insufficient documentation

## 2019-02-13 DIAGNOSIS — Z202 Contact with and (suspected) exposure to infections with a predominantly sexual mode of transmission: Secondary | ICD-10-CM | POA: Insufficient documentation

## 2019-02-13 LAB — URINALYSIS, ROUTINE W REFLEX MICROSCOPIC
Bilirubin Urine: NEGATIVE
Glucose, UA: NEGATIVE mg/dL
Hgb urine dipstick: NEGATIVE
Ketones, ur: 5 mg/dL — AB
Nitrite: NEGATIVE
Protein, ur: NEGATIVE mg/dL
Specific Gravity, Urine: 1.032 — ABNORMAL HIGH (ref 1.005–1.030)
WBC, UA: >50 WBC/hpf — ABNORMAL HIGH (ref 0–5)
pH: 6 (ref 5.0–8.0)

## 2019-02-13 LAB — HIV ANTIBODY (ROUTINE TESTING W REFLEX): HIV Screen 4th Generation wRfx: NONREACTIVE

## 2019-02-13 MED ORDER — AZITHROMYCIN 250 MG PO TABS
1000.0000 mg | ORAL_TABLET | Freq: Once | ORAL | Status: AC
  Administered 2019-02-13: 09:00:00 1000 mg via ORAL
  Filled 2019-02-13: qty 4

## 2019-02-13 MED ORDER — CEFTRIAXONE SODIUM 250 MG IJ SOLR
250.0000 mg | Freq: Once | INTRAMUSCULAR | Status: AC
  Administered 2019-02-13: 250 mg via INTRAMUSCULAR
  Filled 2019-02-13: qty 250

## 2019-02-13 NOTE — ED Provider Notes (Signed)
[facility] EMERGENCY DEPARTMENT Provider Note   CSN: [id number] Arrival date & time: 02/13/19  0831    History Chief Complaint  Patient presents with  . Exposure to STD   Jeremy Zhang is a 24 y.o. male with no significant past medical history who presents for evaluation of penile discharge.  Patient states he has had intermittent green penile discharge over the last 3 days.  Patient states he is sexually active with females only.  He uses protection intermittently.  Denies fever, chills, nausea, vomiting, chest pain, abdominal pain, testicular pain, pain with bowel movements, rashes, lesions, hematuria, dysuria.  Denies additional aggravating or alleviating factors.  History obtained from patient and past medical records.  No interpreter is used.  HPI     History reviewed. No pertinent past medical history.  There are no problems to display for this patient.   History reviewed. No pertinent surgical history.     No family history on file.  Social History   Tobacco Use  . Smoking status: Never Smoker  . Smokeless tobacco: Never Used  Substance Use Topics  . Alcohol use: No  . Drug use: Not on file    Home Medications Prior to Admission medications   Medication Sig Start Date End Date Taking? Authorizing Provider  cyclobenzaprine (FLEXERIL) 10 MG tablet Take 1 tablet (10 mg total) by mouth 2 (two) times daily as needed for muscle spasms. 11/08/18   Robinson, [place] N, [name]  diphenhydrAMINE (BENADRYL) 25 mg capsule Take 1 capsule (25 mg total) by mouth every 8 (eight) hours as needed for itching or allergies. 01/26/14   Earley Favor, NP  ibuprofen (ADVIL,MOTRIN) 600 MG tablet Take 1 tablet (600 mg total) by mouth every 6 (six) hours as needed. 02/25/18   Joy, Shawn C, [name]  lidocaine (LIDODERM) 5 % Place 1 patch onto the skin daily. Remove & Discard patch within 12 hours or as directed by MD 02/25/18   Joy, Shawn C, [name]  methocarbamol (ROBAXIN) 500 MG  tablet Take 1 tablet (500 mg total) by mouth 2 (two) times daily. 02/25/18   Joy, Hillard Danker, [name]    Allergies    Patient has no known allergies.  Review of Systems   Review of Systems  Constitutional: Negative.   HENT: Negative.   Respiratory: Negative.   Cardiovascular: Negative.   Gastrointestinal: Negative.   Genitourinary: Positive for discharge. Negative for decreased urine volume, difficulty urinating, dysuria, flank pain, frequency, genital sores, hematuria, penile pain, penile swelling, scrotal swelling, testicular pain and urgency.  Musculoskeletal: Negative.   Skin: Negative.   Neurological: Negative.   All other systems reviewed and are negative.   Physical Exam Updated Vital Signs BP 124/77 (BP Location: Right Arm)   Pulse (!) 53   Temp 98.5 F (36.9 C) (Oral)   Resp 12   SpO2 100%   Physical Exam Vitals and nursing note reviewed. Exam conducted with a chaperone present.  Constitutional:      General: He is not in acute distress.    Appearance: He is well-developed. He is not ill-appearing, toxic-appearing or diaphoretic.  HENT:     Head: Normocephalic and atraumatic.     Nose: Nose normal.     Mouth/Throat:     Mouth: Mucous membranes are moist.     Pharynx: Oropharynx is clear.  Eyes:     Pupils: Pupils are equal, round, and reactive to light.  Cardiovascular:     Rate and Rhythm: Normal rate and  regular rhythm.     Pulses: Normal pulses.     Heart sounds: Normal heart sounds.  Pulmonary:     Effort: Pulmonary effort is normal. No respiratory distress.     Breath sounds: Normal breath sounds.  Abdominal:     General: Bowel sounds are normal. There is no distension.     Palpations: Abdomen is soft.     Tenderness: There is no abdominal tenderness. There is no right CVA tenderness, left CVA tenderness, guarding or rebound.     Hernia: No hernia is present. There is no hernia in the left inguinal area or right inguinal area.     Comments: Soft, nontender  without rebound or guarding.  No pelvic lymphadenopathy.  Normoactive bowel sounds.  No abdominal wall herniations.  No overlying skin changes to abdominal wall  Genitourinary:    Pubic Area: No rash or pubic lice.      Penis: Discharge (Dried) present. No phimosis, paraphimosis, hypospadias, erythema, tenderness, swelling or lesions.      Testes: Normal. Cremasteric reflex is present.     Epididymis:     Right: Normal.     Left: Normal.     Comments: Nursing tech in room for GU exam.  Patient with a dry discharge to urethral meatus.  Testes with normal cremasteric reflex present.  No testicular tenderness, no tenderness over epididymis. No rashes or lesions Musculoskeletal:        General: Normal range of motion.     Cervical back: Normal range of motion and neck supple.     Comments: Moves all 4 extremities without difficulty  Lymphadenopathy:     Lower Body: No right inguinal adenopathy. No left inguinal adenopathy.  Skin:    General: Skin is warm and dry.     Capillary Refill: Capillary refill takes less than 2 seconds.     Comments: Brisk cap refill. No edema, erythema, warmth. No fluctuance, induration. No vesicles , lesions  Neurological:     Mental Status: He is alert.     Comments: Ambulatory without difficulty    ED Results / Procedures / Treatments   Labs (all labs ordered are listed, but only abnormal results are displayed) Labs Reviewed  URINALYSIS, ROUTINE W REFLEX MICROSCOPIC - Abnormal; Notable for the following components:      Result Value   APPearance HAZY (*)    Specific Gravity, Urine 1.032 (*)    Ketones, ur 5 (*)    Leukocytes,Ua MODERATE (*)    WBC, UA >50 (*)    Bacteria, UA FEW (*)    All other components within normal limits  RPR  HIV ANTIBODY (ROUTINE TESTING W REFLEX)  GC/CHLAMYDIA PROBE AMP ([place]) NOT AT [facility]    EKG None  Radiology No results found.  Procedures Procedures (including critical care time)  Medications Ordered in  ED Medications  cefTRIAXone (ROCEPHIN) injection 250 mg (250 mg Intramuscular Given 02/13/19 0913)  azithromycin (ZITHROMAX) tablet 1,000 mg (1,000 mg Oral Given 02/13/19 0912)    ED Course  I have reviewed the triage vital signs and the nursing notes.  Pertinent labs & imaging results that were available during my care of the patient were reviewed by me and considered in my medical decision making (see chart for details).   24 year old presents for evaluation of penile discharge. Afebrile, non septic, non ill appearing. Sexually active with females only. No prior hx of STDs. No abd or pelvis lymphadenopathy. Urinating without difficulty. Abd soft, non surgical  abdomen. Tolerating PO intake at home without difficulty.  Patient without abdominal tenderness, abdominal pain or painful bowel movements to indicate prostatitis.  No tenderness to palpation of the testes or epididymis to suggest orchitis or epididymitis.  STD cultures obtained including HIV, syphilis, gonorrhea and chlamydia. Patient to be discharged with instructions to follow up with PCP. Discussed importance of using protection when sexually active. Pt understands that they have GC/Chlamydia cultures pending and that they will need to inform all sexual partners if results return positive. Patient has been treated prophylactically with azithromycin and Rocephin.   The patient has been appropriately medically screened and/or stabilized in the ED. I have low suspicion for any other emergent medical condition which would require further screening, evaluation or treatment in the ED or require inpatient management.  Clinical Course as of Feb 12 954  Fri Feb 13, 2019  0955 Moderate Leuks, >50 WBC, bacteria. Received roceophin and azithro for  Urinalysis, Routine w reflex microscopic(!) [BH]    Clinical Course User Index [BH] Marine Lezotte A, [name]   MDM Rules/Calculators/A&P                       Final Clinical Impression(s) / ED  Diagnoses Final diagnoses:  Concern about STD in male without diagnosis  Penile discharge    Rx / DC Orders ED Discharge Orders    None       Terry Bolotin A, [name] 02/13/19 8768    Vanetta Mulders, MD 02/13/19 1048

## 2019-02-13 NOTE — Discharge Instructions (Addendum)
Your STD testing is pending at discharge. You will be called if positive. You will need to notify your partners at that time. You were given antibiotics for possible gonorrhea and chlamydia.  If your test is positive he will not need to seek additional treatment.  If your test for HIV and syphilis is positive you will need to follow-up with infectious disease.  Return to the emergency department for any new or worsening symptoms.

## 2019-02-13 NOTE — ED Triage Notes (Signed)
To ED for eval of possible STD. States he noted white drainage from penis and states 'it splits'. No fevers. No abd pain - does states he thinks he has 'knots' in pubic area.

## 2019-02-13 NOTE — ED Notes (Signed)
Pt in gown.

## 2019-02-14 LAB — RPR

## 2019-02-16 LAB — GC/CHLAMYDIA PROBE AMP (~~LOC~~) NOT AT ARMC
Chlamydia: NEGATIVE
Neisseria Gonorrhea: POSITIVE — AB

## 2019-02-25 ENCOUNTER — Emergency Department (HOSPITAL_COMMUNITY)
Admission: EM | Admit: 2019-02-25 | Discharge: 2019-02-25 | Disposition: A | Discharge status: Home / Self Care | Payer: Self-pay | Attending: Emergency Medicine | Admitting: Emergency Medicine

## 2019-02-25 ENCOUNTER — Other Ambulatory Visit: Payer: Self-pay

## 2019-02-25 ENCOUNTER — Encounter (HOSPITAL_COMMUNITY): Payer: Self-pay

## 2019-02-25 ENCOUNTER — Emergency Department (HOSPITAL_COMMUNITY): Payer: Self-pay

## 2019-02-25 DIAGNOSIS — N453 Epididymo-orchitis: Secondary | ICD-10-CM | POA: Insufficient documentation

## 2019-02-25 DIAGNOSIS — R609 Edema, unspecified: Secondary | ICD-10-CM

## 2019-02-25 LAB — URINALYSIS, ROUTINE W REFLEX MICROSCOPIC
Bilirubin Urine: NEGATIVE
Glucose, UA: NEGATIVE mg/dL
Hgb urine dipstick: NEGATIVE
Ketones, ur: NEGATIVE mg/dL
Leukocytes,Ua: NEGATIVE
Nitrite: NEGATIVE
Protein, ur: 100 mg/dL — AB
Specific Gravity, Urine: 1.035 — ABNORMAL HIGH (ref 1.005–1.030)
pH: 5 (ref 5.0–8.0)

## 2019-02-25 MED ORDER — NAPROXEN 250 MG PO TABS
500.0000 mg | ORAL_TABLET | Freq: Once | ORAL | Status: AC
  Administered 2019-02-25: 500 mg via ORAL
  Filled 2019-02-25: qty 2

## 2019-02-25 MED ORDER — STERILE WATER FOR INJECTION IJ SOLN
INTRAMUSCULAR | Status: AC
  Filled 2019-02-25: qty 10

## 2019-02-25 MED ORDER — NAPROXEN 500 MG PO TABS: 500.0000 mg | ORAL_TABLET | Freq: Two times a day (BID) | ORAL | 0 refills | Status: DC

## 2019-02-25 MED ORDER — CEFTRIAXONE SODIUM 500 MG IJ SOLR
500.0000 mg | Freq: Once | INTRAMUSCULAR | Status: AC
  Administered 2019-02-25: 500 mg via INTRAMUSCULAR
  Filled 2019-02-25: qty 500

## 2019-02-25 MED ORDER — DOXYCYCLINE HYCLATE 100 MG PO TABS
100.0000 mg | ORAL_TABLET | Freq: Once | ORAL | Status: AC
  Administered 2019-02-25: 19:00:00 100 mg via ORAL
  Filled 2019-02-25: qty 1

## 2019-02-25 MED ORDER — DOXYCYCLINE HYCLATE 100 MG PO CAPS
100.0000 mg | ORAL_CAPSULE | Freq: Two times a day (BID) | ORAL | 0 refills | Status: AC
Start: 2019-02-25 — End: 2019-03-07

## 2019-02-25 NOTE — ED Notes (Signed)
Patient verbalizes understanding of discharge instructions. Opportunity for questioning and answers were provided. Armband removed by staff, pt discharged from ED ambulatory to home.  

## 2019-02-25 NOTE — ED Provider Notes (Signed)
[facility] EMERGENCY DEPARTMENT Provider Note   CSN: [id number] Arrival date & time: 02/25/19  1428     History Chief Complaint  Patient presents with  . Groin Swelling    Jeremy Zhang is a 24 y.o. male.  Jeremy Zhang is a 24 y.o. male who is otherwise healthy, who presents to the ED for evaluation of 2 days of left-sided scrotal pain and swelling.  He reports the area is tender to the touch and is also painful when it rubs against his thigh.  He has not noted any redness or skin changes.  He denies any dysuria or urinary frequency.  Denies any associated abdominal pain has not noted any genital lesions or masses.  States that a little over a week ago he was treated for STDs, and urinary symptoms seem to subside, but then he developed this testicular pain.  No associated nausea or vomiting.  No fevers or chills.  He denies any new sexual partners.  Reports he is up-to-date on vaccinations.  He has not taken anything to treat pain.  No other aggravating or alleviating factors.        History reviewed. No pertinent past medical history.  There are no problems to display for this patient.   History reviewed. No pertinent surgical history.     No family history on file.  Social History   Tobacco Use  . Smoking status: Never Smoker  . Smokeless tobacco: Never Used  Substance Use Topics  . Alcohol use: No  . Drug use: Not on file    Home Medications Prior to Admission medications   Medication Sig Start Date End Date Taking? Authorizing Provider  cyclobenzaprine (FLEXERIL) 10 MG tablet Take 1 tablet (10 mg total) by mouth 2 (two) times daily as needed for muscle spasms. Patient not taking: Reported on 02/25/2019 11/08/18   Robinson, [place] N, [name]  diphenhydrAMINE (BENADRYL) 25 mg capsule Take 1 capsule (25 mg total) by mouth every 8 (eight) hours as needed for itching or allergies. Patient not taking: Reported on 02/25/2019 01/26/14   Earley Favor, NP   doxycycline (VIBRAMYCIN) 100 MG capsule Take 1 capsule (100 mg total) by mouth 2 (two) times daily for 10 days. 02/25/19 03/07/19  Dartha Lodge, [name]  ibuprofen (ADVIL,MOTRIN) 600 MG tablet Take 1 tablet (600 mg total) by mouth every 6 (six) hours as needed. Patient not taking: Reported on 02/25/2019 02/25/18   Joy, Shawn C, [name]  lidocaine (LIDODERM) 5 % Place 1 patch onto the skin daily. Remove & Discard patch within 12 hours or as directed by MD Patient not taking: Reported on 02/25/2019 02/25/18   Joy, Hillard Danker, [name]  methocarbamol (ROBAXIN) 500 MG tablet Take 1 tablet (500 mg total) by mouth 2 (two) times daily. Patient not taking: Reported on 02/25/2019 02/25/18   Joy, Ines Bloomer C, [name]  naproxen (NAPROSYN) 500 MG tablet Take 1 tablet (500 mg total) by mouth 2 (two) times daily. 02/25/19   Dartha Lodge, [name]    Allergies    Patient has no known allergies.  Review of Systems   Review of Systems  Constitutional: Negative for chills and fever.  Gastrointestinal: Negative for abdominal pain, nausea and vomiting.  Genitourinary: Positive for scrotal swelling and testicular pain. Negative for discharge, dysuria, frequency, genital sores and penile pain.  Skin: Negative for color change and rash.    Physical Exam Updated Vital Signs BP (!) 116/57 (BP Location: Right Arm)   Pulse 67  Temp 98.1 F (36.7 C) (Oral)   Resp 14   Ht 5\' 8"  (1.727 m)   Wt 56.7 kg   SpO2 100%   BMI 19.01 kg/m   Physical Exam Vitals and nursing note reviewed. Exam conducted with a chaperone present.  Constitutional:      General: He is not in acute distress.    Appearance: Normal appearance. He is well-developed and normal weight. He is not ill-appearing or diaphoretic.  HENT:     Head: Normocephalic and atraumatic.  Eyes:     General:        Right eye: No discharge.        Left eye: No discharge.  Pulmonary:     Effort: Pulmonary effort is normal. No respiratory distress.  Abdominal:     General:  Abdomen is flat. Bowel sounds are normal. There is no distension.     Palpations: Abdomen is soft. There is no mass.     Tenderness: There is no abdominal tenderness. There is no guarding.  Genitourinary:    Comments: Chaperone present for genital exam. No genital lesions or lymphadenopathy noted. Penis normal without tenderness or discharge. Mild tenderness over both testicles, tenderness noted over the epididymis on the left. Skin:    General: Skin is warm and dry.  Neurological:     Mental Status: He is alert and oriented to person, place, and time.     Coordination: Coordination normal.  Psychiatric:        Mood and Affect: Mood normal.        Behavior: Behavior normal.     ED Results / Procedures / Treatments   Labs (all labs ordered are listed, but only abnormal results are displayed) Labs Reviewed  URINALYSIS, ROUTINE W REFLEX MICROSCOPIC - Abnormal; Notable for the following components:      Result Value   Specific Gravity, Urine 1.035 (*)    Protein, ur 100 (*)    Bacteria, UA RARE (*)    All other components within normal limits  GC/CHLAMYDIA PROBE AMP ([place]) NOT AT [facility]    EKG None  Radiology [facility] SCROTUM W/DOPPLER  Result Date: 02/25/2019 CLINICAL DATA:  Left testicular swelling and pain for the past 3 days. EXAM: SCROTAL ULTRASOUND DOPPLER ULTRASOUND OF THE TESTICLES TECHNIQUE: Complete ultrasound examination of the testicles, epididymis, and other scrotal structures was performed. Color and spectral Doppler ultrasound were also utilized to evaluate blood flow to the testicles. COMPARISON:  None. FINDINGS: Right testicle Measurements: 3.5 x 1.8 x 2.7 cm. Mild parenchymal heterogeneity with increased flow. No mass or microlithiasis visualized. Left testicle Measurements: 3.7 x 1.9 x 2.9 cm. No mass or microlithiasis visualized. Right epididymis: Normal in size and appearance. 6 mm epididymal head cyst. Left epididymis: Normal in size and appearance. 3 mm  epididymal head cyst. Hydrocele:  None visualized. Varicocele:  Left varicocele. Pulsed Doppler interrogation of both testes demonstrates normal low resistance arterial and venous waveforms bilaterally. IMPRESSION: 1. Mild parenchymal heterogeneity and increased flow of the RIGHT testicle, potentially reflecting orchitis. 2. Left varicocele. Electronically Signed   By: 02/27/2019 M.D.   On: 02/25/2019 16:57    Procedures Procedures (including critical care time)  Medications Ordered in ED Medications  cefTRIAXone (ROCEPHIN) injection 500 mg (500 mg Intramuscular Given 02/25/19 1842)  doxycycline (VIBRA-TABS) tablet 100 mg (100 mg Oral Given 02/25/19 1840)  naproxen (NAPROSYN) tablet 500 mg (500 mg Oral Given 02/25/19 1840)  sterile water (preservative free) injection (  Given 02/25/19 1844)  ED Course  I have reviewed the triage vital signs and the nursing notes.  Pertinent labs & imaging results that were available during my care of the patient were reviewed by me and considered in my medical decision making (see chart for details).    MDM Rules/Calculators/A&P                      24 year old male presents with 2 days of testicular pain, he was recently treated for STDs, but on chart review it shows that patient received only 250 of IM Rocephin and 1000 mg of azithromycin, he has since developed testicular pain with somatic cord tenderness concerning for epididymitis.  Ultrasound shows evidence of right-sided orchitis as well as a left-sided varicocele, given tenderness concern for early orchitis and epididymitis potentially in the setting of under treatment for STDs.  Will give 500 of IM Rocephin and start patient on 10 days of doxycycline, urology follow-up encouraged.  Discussed NSAIDs, Tylenol and supportive underwear for pain management.  Patient expresses understanding and agreement with plan.  Discharged home in good condition. Final Clinical Impression(s) / ED Diagnoses Final  diagnoses:  Orchitis and epididymitis    Rx / DC Orders ED Discharge Orders         Ordered    doxycycline (VIBRAMYCIN) 100 MG capsule  2 times daily     02/25/19 1808    naproxen (NAPROSYN) 500 MG tablet  2 times daily     02/25/19 1808           Janet Berlin 02/25/19 1905    Virgel Manifold, MD 02/25/19 1925

## 2019-02-25 NOTE — ED Notes (Signed)
Patient transported to Ultrasound 

## 2019-02-25 NOTE — ED Triage Notes (Signed)
Pt reports left sided scrotal pain and swelling for 2 days, tender to touch. No urinary s/s

## 2019-02-25 NOTE — ED Notes (Signed)
Pt went to [facility]

## 2019-02-25 NOTE — Discharge Instructions (Addendum)
You likely have epididymitis and orchitis, and inflammation of the epididymal sac and testicle, this can be commonly caused by viruses or by bacterial infections, most commonly STDs.  Take naproxen twice daily for pain, you can take Tylenol 1000 mg every 8 hours as needed for additional pain.  You can also apply ice.  Wear supportive underwear.  Take doxycycline twice daily for the next 10 days, take this with food on your stomach.  It is very important that you complete entire course of antibiotics, even if your symptoms have resolved.  If your symptoms or not improving please follow-up with urology.

## 2019-03-11 ENCOUNTER — Emergency Department (HOSPITAL_COMMUNITY)
Admission: EM | Admit: 2019-03-11 | Discharge: 2019-03-11 | Disposition: A | Discharge status: Home / Self Care | Payer: Self-pay | Attending: Emergency Medicine | Admitting: Emergency Medicine

## 2019-03-11 ENCOUNTER — Encounter (HOSPITAL_COMMUNITY): Payer: Self-pay | Admitting: Emergency Medicine

## 2019-03-11 ENCOUNTER — Other Ambulatory Visit: Payer: Self-pay

## 2019-03-11 ENCOUNTER — Emergency Department (HOSPITAL_COMMUNITY): Payer: Self-pay

## 2019-03-11 DIAGNOSIS — R0789 Other chest pain: Secondary | ICD-10-CM | POA: Insufficient documentation

## 2019-03-11 LAB — BASIC METABOLIC PANEL
Anion gap: 10 (ref 5–15)
BUN: 12 mg/dL (ref 6–20)
CO2: 21 mmol/L — ABNORMAL LOW (ref 22–32)
Calcium: 9.5 mg/dL (ref 8.9–10.3)
Chloride: 107 mmol/L (ref 98–111)
Creatinine, Ser: 0.88 mg/dL (ref 0.61–1.24)
GFR calc Af Amer: >60 mL/min (ref >60)
GFR calc non Af Amer: >60 mL/min (ref >60)
Glucose, Bld: 89 mg/dL (ref 70–99)
Potassium: 3.9 mmol/L (ref 3.5–5.1)
Sodium: 138 mmol/L (ref 135–145)

## 2019-03-11 LAB — CBC
HCT: 39.2 % (ref 39.0–52.0)
Hemoglobin: 13.5 g/dL (ref 13.0–17.0)
MCH: 33.9 pg (ref 26.0–34.0)
MCHC: 34.4 g/dL (ref 30.0–36.0)
MCV: 98.5 fL (ref 80.0–100.0)
Platelets: 274 10*3/uL (ref 150–400)
RBC: 3.98 MIL/uL — ABNORMAL LOW (ref 4.22–5.81)
RDW: 11.2 % — ABNORMAL LOW (ref 11.5–15.5)
WBC: 9.2 10*3/uL (ref 4.0–10.5)
nRBC: 0 % (ref 0.0–0.2)

## 2019-03-11 LAB — TROPONIN I (HIGH SENSITIVITY): Troponin I (High Sensitivity): 2 ng/L (ref <18)

## 2019-03-11 MED ORDER — SODIUM CHLORIDE 0.9% FLUSH: 3.0000 mL | Freq: Once | INTRAVENOUS | Status: DC

## 2019-03-11 NOTE — Discharge Instructions (Signed)
If your chest pain worsens, returns, changes or you have other concerns please seek additional medical care and evaluation.

## 2019-03-11 NOTE — ED Triage Notes (Signed)
Pt arrives to ED from home with complaints of left sided chest pain that has been on and off for a year now. Patient states that nothing acutely happened today he just wanted to get checked out.

## 2019-03-11 NOTE — ED Provider Notes (Signed)
[facility] EMERGENCY DEPARTMENT Provider Note   CSN: [id number] Arrival date & time: 03/11/19  1746     History Chief Complaint  Patient presents with  . Chest Pain    Jeremy Zhang is a 24 y.o. male with no significant past medical history who presents today for evaluation of left-sided chest pain.  He reports that intermittently over the past year he will feel a sudden sharp pain in the left side of his chest.  He has learned that then "lifts" the left side of his chest up, feels a pop and the pain goes away.  He states that this happens randomly, is not associated with activity.  He reports that after he does this his pain improves to a 1 or 2 out of 10 which is what his pain is now.  He denies any fevers.  No cough or shortness of breath.  He denies any known sick contacts.  Pain does not radiate or move.  No nausea vomiting or diarrhea.  No diaphoresis.  No personal history of cardiac abnormalities. He denies use of cocaine, amphetamines, or stimulants.  He denies any personal history of coagulopathies or DVT.  No leg swelling.  No hemoptysis, hormone use, recent travel, surgery or immobilization.    HPI     History reviewed. No pertinent past medical history.  There are no problems to display for this patient.   History reviewed. No pertinent surgical history.     History reviewed. No pertinent family history.  Social History   Tobacco Use  . Smoking status: Never Smoker  . Smokeless tobacco: Never Used  Substance Use Topics  . Alcohol use: No  . Drug use: Yes    Types: Marijuana    Home Medications Prior to Admission medications   Medication Sig Start Date End Date Taking? Authorizing Provider  cyclobenzaprine (FLEXERIL) 10 MG tablet Take 1 tablet (10 mg total) by mouth 2 (two) times daily as needed for muscle spasms. Patient not taking: Reported on 02/25/2019 11/08/18   Robinson, [place] N, [name]  diphenhydrAMINE (BENADRYL) 25 mg capsule  Take 1 capsule (25 mg total) by mouth every 8 (eight) hours as needed for itching or allergies. Patient not taking: Reported on 02/25/2019 01/26/14   Earley Favor, NP  ibuprofen (ADVIL,MOTRIN) 600 MG tablet Take 1 tablet (600 mg total) by mouth every 6 (six) hours as needed. Patient not taking: Reported on 02/25/2019 02/25/18   Joy, Shawn C, [name]  lidocaine (LIDODERM) 5 % Place 1 patch onto the skin daily. Remove & Discard patch within 12 hours or as directed by MD Patient not taking: Reported on 02/25/2019 02/25/18   Joy, Hillard Danker, [name]  methocarbamol (ROBAXIN) 500 MG tablet Take 1 tablet (500 mg total) by mouth 2 (two) times daily. Patient not taking: Reported on 02/25/2019 02/25/18   Joy, Ines Bloomer C, [name]  naproxen (NAPROSYN) 500 MG tablet Take 1 tablet (500 mg total) by mouth 2 (two) times daily. 02/25/19   Dartha Lodge, [name]    Allergies    Patient has no known allergies.  Review of Systems   Review of Systems  Constitutional: Negative for chills and fever.  Respiratory: Negative for chest tightness and shortness of breath.   Cardiovascular: Positive for chest pain. Negative for palpitations and leg swelling.  Gastrointestinal: Negative for abdominal pain.  Musculoskeletal: Negative for back pain.  Psychiatric/Behavioral: Negative for confusion.  All other systems reviewed and are negative.   Physical Exam Updated Vital Signs  BP 107/75 (BP Location: Left Arm)   Pulse 61   Temp 98.6 F (37 C) (Oral)   Resp 12   SpO2 100%   Physical Exam Vitals and nursing note reviewed.  Constitutional:      General: He is not in acute distress.    Appearance: He is well-developed. He is not diaphoretic.  HENT:     Head: Normocephalic and atraumatic.  Eyes:     General: No scleral icterus.       Right eye: No discharge.        Left eye: No discharge.     Conjunctiva/sclera: Conjunctivae normal.  Cardiovascular:     Rate and Rhythm: Normal rate and regular rhythm.     Heart sounds: Normal  heart sounds.  Pulmonary:     Effort: Pulmonary effort is normal. No respiratory distress.     Breath sounds: No stridor.  Chest:     Chest wall: No mass, swelling, crepitus or edema.     Comments: Localized tenderness to palpation over the left inferior lateral chest/anterior axilla.  Palpation here both recreates and exacerbates his reported pain.  There is also tenderness to palpation along the anterior sternocostal joints which also recreates and exacerbates his reported pain. Abdominal:     General: There is no distension.  Musculoskeletal:        General: No deformity.     Cervical back: Normal range of motion.     Right lower leg: No tenderness. No edema.     Left lower leg: No tenderness. No edema.  Lymphadenopathy:     Upper Body:     Left upper body: No axillary adenopathy.  Skin:    General: Skin is warm and dry.  Neurological:     Mental Status: He is alert.     Motor: No abnormal muscle tone.  Psychiatric:        Behavior: Behavior normal.     ED Results / Procedures / Treatments   Labs (all labs ordered are listed, but only abnormal results are displayed) Labs Reviewed  BASIC METABOLIC PANEL - Abnormal; Notable for the following components:      Result Value   CO2 21 (*)    All other components within normal limits  CBC - Abnormal; Notable for the following components:   RBC 3.98 (*)    RDW 11.2 (*)    All other components within normal limits  TROPONIN I (HIGH SENSITIVITY)  TROPONIN I (HIGH SENSITIVITY)    EKG EKG Interpretation  Date/Time:  Wednesday March 11 2019 18:33:08 EST Ventricular Rate:  51 PR Interval:    QRS Duration: 95 QT Interval:  387 QTC Calculation: 357 R Axis:   81 Text Interpretation: Sinus rhythm Probable left ventricular hypertrophy ST elev, probable normal early repol pattern No significant change since last tracing Confirmed by Dorie Rank [phone]) on 03/11/2019 7:10:23 PM   Radiology DG Chest 2 View  Result Date:  03/11/2019 CLINICAL DATA:  Chest pain. EXAM: CHEST - 2 VIEW COMPARISON:  October 28, 2012 FINDINGS: The heart size and mediastinal contours are within normal limits. Both lungs are clear. The visualized skeletal structures are unremarkable. IMPRESSION: No active cardiopulmonary disease. Electronically Signed   By: Virgina Norfolk M.D.   On: 03/11/2019 18:50    Procedures Procedures (including critical care time)  Medications Ordered in ED Medications  sodium chloride flush (NS) 0.9 % injection 3 mL (has no administration in time range)    ED Course  I  have reviewed the triage vital signs and the nursing notes.  Pertinent labs & imaging results that were available during my care of the patient were reviewed by me and considered in my medical decision making (see chart for details).    MDM Rules/Calculators/A&P                     Patient is to be discharged with recommendation to follow up with PCP in regards to today's hospital visit. Chest pain is not likely of cardiac or pulmonary etiology d/t presentation, PERC negative, VSS, no tracheal deviation, no JVD or new murmur, RRR, breath sounds equal bilaterally, EKG without acute abnormalities, negative troponin, and negative CXR. Pt has been advised to return to the ED if CP becomes exertional, associated with diaphoresis or nausea, radiates to left jaw/arm, worsens or becomes concerning in any way. Pt appears reliable for follow up and is agreeable to discharge.   Suspect MSK chest wall pain.   Return precautions were discussed with patient who states their understanding.  At the time of discharge patient denied any unaddressed complaints or concerns.  Patient is agreeable for discharge home.  Note: Portions of this report may have been transcribed using voice recognition software. Every effort was made to ensure accuracy; however, inadvertent computerized transcription errors may be present  Final Clinical Impression(s) / ED  Diagnoses Final diagnoses:  Atypical chest pain    Rx / DC Orders ED Discharge Orders    None       Norman Clay 03/11/19 1942    Linwood Dibbles, MD 03/11/19 2310

## 2019-03-11 NOTE — ED Notes (Signed)
Patient verbalizes understanding of discharge instructions. Opportunity for questioning and answers were provided. Armband removed by staff, pt discharged from ED.  

## 2019-03-16 ENCOUNTER — Emergency Department (HOSPITAL_COMMUNITY): Payer: Self-pay

## 2019-03-16 ENCOUNTER — Encounter (HOSPITAL_COMMUNITY): Payer: Self-pay | Admitting: Emergency Medicine

## 2019-03-16 ENCOUNTER — Other Ambulatory Visit: Payer: Self-pay

## 2019-03-16 ENCOUNTER — Emergency Department (HOSPITAL_COMMUNITY)
Admission: EM | Admit: 2019-03-16 | Discharge: 2019-03-16 | Disposition: A | Discharge status: Home / Self Care | Payer: Self-pay | Attending: Emergency Medicine | Admitting: Emergency Medicine

## 2019-03-16 DIAGNOSIS — S20211A Contusion of right front wall of thorax, initial encounter: Secondary | ICD-10-CM | POA: Insufficient documentation

## 2019-03-16 DIAGNOSIS — T07XXXA Unspecified multiple injuries, initial encounter: Secondary | ICD-10-CM

## 2019-03-16 DIAGNOSIS — S0083XA Contusion of other part of head, initial encounter: Secondary | ICD-10-CM | POA: Insufficient documentation

## 2019-03-16 DIAGNOSIS — Y999 Unspecified external cause status: Secondary | ICD-10-CM | POA: Insufficient documentation

## 2019-03-16 DIAGNOSIS — Z791 Long term (current) use of non-steroidal anti-inflammatories (NSAID): Secondary | ICD-10-CM | POA: Insufficient documentation

## 2019-03-16 DIAGNOSIS — Y929 Unspecified place or not applicable: Secondary | ICD-10-CM | POA: Insufficient documentation

## 2019-03-16 DIAGNOSIS — Y939 Activity, unspecified: Secondary | ICD-10-CM | POA: Insufficient documentation
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
## 2019-03-16 NOTE — Discharge Instructions (Signed)
Use heat on the sore areas 3 or 4 times a day to help the discomfort.  Use Motrin or Tylenol as needed for pain.

## 2019-03-16 NOTE — ED Notes (Signed)
Patient verbalizes understanding of discharge instructions. Opportunity for questioning and answers were provided. Armband removed by staff, pt discharged from ED ambulatory to home.  
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
## 2019-04-06 ENCOUNTER — Other Ambulatory Visit: Payer: Self-pay

## 2019-04-06 ENCOUNTER — Emergency Department (HOSPITAL_COMMUNITY)
Admission: EM | Admit: 2019-04-06 | Discharge: 2019-04-06 | Disposition: A | Discharge status: Home / Self Care | Payer: Self-pay | Attending: Emergency Medicine | Admitting: Emergency Medicine

## 2019-04-06 DIAGNOSIS — Y999 Unspecified external cause status: Secondary | ICD-10-CM | POA: Insufficient documentation

## 2019-04-06 DIAGNOSIS — T22112A Burn of first degree of left forearm, initial encounter: Secondary | ICD-10-CM | POA: Insufficient documentation

## 2019-04-06 DIAGNOSIS — Y9289 Other specified places as the place of occurrence of the external cause: Secondary | ICD-10-CM | POA: Insufficient documentation

## 2019-04-06 DIAGNOSIS — T300 Burn of unspecified body region, unspecified degree: Secondary | ICD-10-CM

## 2019-04-06 DIAGNOSIS — X19XXXA Contact with other heat and hot substances, initial encounter: Secondary | ICD-10-CM | POA: Insufficient documentation

## 2019-04-06 DIAGNOSIS — Y9389 Activity, other specified: Secondary | ICD-10-CM | POA: Insufficient documentation

## 2019-04-06 MED ORDER — BACITRACIN ZINC 500 UNIT/GM EX OINT
TOPICAL_OINTMENT | Freq: Once | CUTANEOUS | Status: AC
  Administered 2019-04-06: 1 via TOPICAL
  Filled 2019-04-06: qty 0.9
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
## 2019-04-06 NOTE — ED Notes (Signed)
Pt refused to stay long enough for me to finish dressing his wound. I applied bacitracin ointment and he took off before I could apply a nonstick pad and wrap in kerlix. Pt very rude upon "discharge". A/o and ambulatory.

## 2019-04-28 ENCOUNTER — Emergency Department (HOSPITAL_COMMUNITY): Payer: Self-pay

## 2019-04-28 ENCOUNTER — Encounter (HOSPITAL_COMMUNITY): Payer: Self-pay | Admitting: Emergency Medicine

## 2019-04-28 ENCOUNTER — Other Ambulatory Visit: Payer: Self-pay

## 2019-04-28 ENCOUNTER — Emergency Department (HOSPITAL_COMMUNITY)
Admission: EM | Admit: 2019-04-28 | Discharge: 2019-04-29 | Disposition: A | Discharge status: Home / Self Care | Payer: Self-pay | Attending: Emergency Medicine | Admitting: Emergency Medicine

## 2019-04-28 DIAGNOSIS — R0789 Other chest pain: Secondary | ICD-10-CM | POA: Insufficient documentation

## 2019-04-28 LAB — BASIC METABOLIC PANEL
Anion gap: 9 (ref 5–15)
BUN: 10 mg/dL (ref 6–20)
CO2: 27 mmol/L (ref 22–32)
Calcium: 9 mg/dL (ref 8.9–10.3)
Chloride: 104 mmol/L (ref 98–111)
Creatinine, Ser: 1.08 mg/dL (ref 0.61–1.24)
GFR calc Af Amer: >60 mL/min (ref >60)
GFR calc non Af Amer: >60 mL/min (ref >60)
Glucose, Bld: 82 mg/dL (ref 70–99)
Potassium: 3.7 mmol/L (ref 3.5–5.1)
Sodium: 140 mmol/L (ref 135–145)

## 2019-04-28 LAB — TROPONIN I (HIGH SENSITIVITY)
Troponin I (High Sensitivity): <2 ng/L (ref <18)
Troponin I (High Sensitivity): 3 ng/L (ref <18)

## 2019-04-28 LAB — CBC
HCT: 39.5 % (ref 39.0–52.0)
Hemoglobin: 13.2 g/dL (ref 13.0–17.0)
MCH: 32.8 pg (ref 26.0–34.0)
MCHC: 33.4 g/dL (ref 30.0–36.0)
MCV: 98 fL (ref 80.0–100.0)
Platelets: 294 10*3/uL (ref 150–400)
RBC: 4.03 MIL/uL — ABNORMAL LOW (ref 4.22–5.81)
RDW: 11.3 % — ABNORMAL LOW (ref 11.5–15.5)
WBC: 7.4 10*3/uL (ref 4.0–10.5)
nRBC: 0 % (ref 0.0–0.2)

## 2019-04-28 MED ORDER — SODIUM CHLORIDE 0.9% FLUSH: 3.0000 mL | Freq: Once | INTRAVENOUS | Status: DC

## 2019-04-28 NOTE — ED Triage Notes (Signed)
Patient reports intermittent left chest pain for several day with mild SOB , no cough or fever , denies emesis or diaphoresis .

## 2019-04-29 MED ORDER — IBUPROFEN 400 MG PO TABS
600.0000 mg | ORAL_TABLET | Freq: Once | ORAL | Status: AC
  Administered 2019-04-29: 600 mg via ORAL
  Filled 2019-04-29: qty 1

## 2019-04-29 MED ORDER — METHOCARBAMOL 500 MG PO TABS: 500.0000 mg | ORAL_TABLET | Freq: Two times a day (BID) | ORAL | 0 refills | Status: DC

## 2019-04-29 MED ORDER — IBUPROFEN 600 MG PO TABS: 600.0000 mg | ORAL_TABLET | Freq: Four times a day (QID) | ORAL | 0 refills | Status: AC | PRN

## 2019-04-29 NOTE — Discharge Instructions (Addendum)
Thank you for allowing me to care for you today in the Emergency Department.   You were seen today in the emergency department for chest pain.  Your work-up was reassuring.  I suspect that this is due to the muscles in your chest wall.   Take 650 mg of Tylenol or 600 mg of ibuprofen with food every 6 hours for pain.  You can alternate between these 2 medications every 3 hours if your pain returns.  For instance, you can take Tylenol at noon, followed by a dose of ibuprofen at 3, followed by second dose of Tylenol and 6.  You can also try taking 1 tablet of Robaxin up to 2 times daily for muscle pain or spasms.  Do not work or drive until you know this medication impacts you patient may make you drowsy.  Start to stretch the muscles of your upper back and chest, especially before you go to work so to do a lot of lifting.  Return to the emergency department if you develop severe shortness of breath, high fevers, if you pass out, new numbness or weakness, or other new, concerning symptoms.

## 2019-04-29 NOTE — ED Notes (Signed)
Pt was discharged from the ED. Pt read and understood discharge paperwork. Pt had vital signs completed. Pt conscious, breathing, and A&Ox4. No distress noted. Pt speaking in complete sentences. Pt ambulated out of the ED with a smooth and steady gait. E-signature not available.  

## 2019-04-29 NOTE — ED Provider Notes (Signed)
[facility] EMERGENCY DEPARTMENT Provider Note   CSN: [id number] Arrival date & time: 04/28/19  1808     History Chief Complaint  Patient presents with  . Chest Pain    Jeremy Zhang is a 23 y.o. male 24 year old male with no significant past medical history presenting with left-sided intermittent chest pain for the last 6 years.  Reports the pain is worse with some positional changes.  He sometimes feels as if the area will "catch" and he can massage it and the pain will improve.  Reports this has been happening twice weekly since he was 24.  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  No treatment prior to arrival.  He denies fever, chills, shortness of breath, leg swelling, palpitations, dizziness lightheadedness, abdominal pain, or N/V/D.   The history is provided by the patient. No language interpreter was used.       History reviewed. No pertinent past medical history.  There are no problems to display for this patient.   History reviewed. No pertinent surgical history.     No family history on file.  Social History   Tobacco Use  . Smoking status: Never Smoker  . Smokeless tobacco: Never Used  Substance Use Topics  . Alcohol use: No  . Drug use: Yes    Types: Marijuana    Home Medications Prior to Admission medications   Medication Sig Start Date End Date Taking? Authorizing Provider  ibuprofen (ADVIL) 600 MG tablet Take 1 tablet (600 mg total) by mouth every 6 (six) hours as needed. 04/29/19   Winford Hehn A, [name]  methocarbamol (ROBAXIN) 500 MG tablet Take 1 tablet (500 mg total) by mouth 2 (two) times daily. 04/29/19   Lenya Sterne A, [name]  diphenhydrAMINE (BENADRYL) 25 mg capsule Take 1 capsule (25 mg total) by mouth every 8 (eight) hours as needed for itching or allergies. Patient not taking: Reported on 02/25/2019 01/26/14 04/29/19  Junius Creamer, NP    Allergies    Patient has no known  allergies.  Review of Systems   Review of Systems  Constitutional: Negative for appetite change, chills, diaphoresis and fever.  Respiratory: Negative for shortness of breath and wheezing.   Cardiovascular: Positive for chest pain. Negative for palpitations and leg swelling.  Gastrointestinal: Negative for abdominal pain, constipation, diarrhea, nausea and vomiting.  Genitourinary: Negative for dysuria.  Musculoskeletal: Negative for back pain.  Skin: Negative for rash.  Allergic/Immunologic: Negative for immunocompromised state.  Neurological: Negative for seizures, syncope, weakness, numbness and headaches.  Psychiatric/Behavioral: Negative for confusion.    Physical Exam Updated Vital Signs BP 112/68 (BP Location: Right Arm)   Pulse (!) 57   Temp 98.4 F (36.9 C) (Oral)   Resp 19   Ht 5\' 8"  (1.727 m)   Wt 65 kg   SpO2 100%   BMI 21.79 kg/m   Physical Exam Vitals and nursing note reviewed.  Constitutional:      Appearance: He is well-developed.     Comments: Well-appearing thin male.  No acute distress.  HENT:     Head: Normocephalic.  Eyes:     Conjunctiva/sclera: Conjunctivae normal.  Cardiovascular:     Rate and Rhythm: Normal rate and regular rhythm.     Pulses: Normal pulses.     Heart sounds: Normal heart sounds. No murmur. No friction rub. No gallop.   Pulmonary:     Effort: Pulmonary effort is normal. No respiratory distress.  Breath sounds: No stridor. No wheezing, rhonchi or rales.     Comments: Reproducible tenderness to palpation to the left upper anterior chest wall.  No focal tenderness over the anterolateral ribs.  No overlying rashes, redness, or warmth.  No crepitus or step-offs. Chest:     Chest wall: Tenderness present.  Abdominal:     General: There is no distension.     Palpations: Abdomen is soft.  Musculoskeletal:     Cervical back: Neck supple.     Right lower leg: No edema.     Left lower leg: No edema.  Skin:    General: Skin is  warm and dry.  Neurological:     Mental Status: He is alert.  Psychiatric:        Behavior: Behavior normal.     ED Results / Procedures / Treatments   Labs (all labs ordered are listed, but only abnormal results are displayed) Labs Reviewed  CBC - Abnormal; Notable for the following components:      Result Value   RBC 4.03 (*)    RDW 11.3 (*)    All other components within normal limits  BASIC METABOLIC PANEL  TROPONIN I (HIGH SENSITIVITY)  TROPONIN I (HIGH SENSITIVITY)    EKG None  Radiology DG Chest 2 View  Result Date: 04/28/2019 CLINICAL DATA:  Pain EXAM: CHEST - 2 VIEW COMPARISON:  March 16, 2019 FINDINGS: Lungs are clear. Heart size and pulmonary vascularity are normal. No adenopathy. No pneumothorax. No bone lesions. IMPRESSION: No abnormality noted. Electronically Signed   By: Bretta Bang III M.D.   On: 04/28/2019 19:57    Procedures Procedures (including critical care time)  Medications Ordered in ED Medications  sodium chloride flush (NS) 0.9 % injection 3 mL (has no administration in time range)  ibuprofen (ADVIL) tablet 600 mg (has no administration in time range)    ED Course  I have reviewed the triage vital signs and the nursing notes.  Pertinent labs & imaging results that were available during my care of the patient were reviewed by me and considered in my medical decision making (see chart for details).    MDM Rules/Calculators/A&P                      24 year old male with no significant past medical history presenting with left-sided chest pain that has been intermittent for the last 6 years.  He has been evaluated multiple times previously in the ER with reassuring work-ups.  Strongly suspect musculoskeletal etiology as pain is worse with positional changes and he frequently lifts heavy objects due to his occupation.  Pain is also reproducible on exam.  EKG with normal sinus rhythm.  Chest x-ray is unremarkable.  Troponins are negative.   Labs are otherwise unremarkable.  Doubt ACS, aortic dissection, tension pneumothorax, or PE.  Will give ibuprofen in the ER and recommended conservative management.  He can follow-up with primary care as needed.  Return precautions given.  Safe for discharge to home at this time.  Final Clinical Impression(s) / ED Diagnoses Final diagnoses:  Anterior chest wall pain    Rx / DC Orders ED Discharge Orders         Ordered    methocarbamol (ROBAXIN) 500 MG tablet  2 times daily     04/29/19 0148    ibuprofen (ADVIL) 600 MG tablet  Every 6 hours PRN     04/29/19 0148  Barkley Boards, [name] 04/29/19 0154    Shon Baton, MD 04/29/19 [phone]

## 2019-05-04 ENCOUNTER — Other Ambulatory Visit: Payer: Self-pay

## 2019-05-04 ENCOUNTER — Emergency Department (HOSPITAL_COMMUNITY)
Admission: EM | Admit: 2019-05-04 | Discharge: 2019-05-05 | Disposition: A | Discharge status: Home / Self Care | Payer: HRSA Program | Attending: Emergency Medicine | Admitting: Emergency Medicine

## 2019-05-04 ENCOUNTER — Encounter (HOSPITAL_COMMUNITY): Payer: Self-pay | Admitting: Emergency Medicine

## 2019-05-04 DIAGNOSIS — U071 COVID-19: Secondary | ICD-10-CM | POA: Diagnosis not present

## 2019-05-04 DIAGNOSIS — R519 Headache, unspecified: Secondary | ICD-10-CM | POA: Diagnosis present

## 2019-05-04 LAB — CBC WITH DIFFERENTIAL/PLATELET
Abs Immature Granulocytes: 0.01 10*3/uL (ref 0.00–0.07)
Basophils Absolute: 0 10*3/uL (ref 0.0–0.1)
Basophils Relative: 0 %
Eosinophils Absolute: 0.1 10*3/uL (ref 0.0–0.5)
Eosinophils Relative: 1 %
HCT: 40.3 % (ref 39.0–52.0)
Hemoglobin: 13.6 g/dL (ref 13.0–17.0)
Immature Granulocytes: 0 %
Lymphocytes Relative: 57 %
Lymphs Abs: 4.7 10*3/uL — ABNORMAL HIGH (ref 0.7–4.0)
MCH: 32.9 pg (ref 26.0–34.0)
MCHC: 33.7 g/dL (ref 30.0–36.0)
MCV: 97.3 fL (ref 80.0–100.0)
Monocytes Absolute: 0.8 10*3/uL (ref 0.1–1.0)
Monocytes Relative: 10 %
Neutro Abs: 2.6 10*3/uL (ref 1.7–7.7)
Neutrophils Relative %: 32 %
Platelets: 275 10*3/uL (ref 150–400)
RBC: 4.14 MIL/uL — ABNORMAL LOW (ref 4.22–5.81)
RDW: 11.4 % — ABNORMAL LOW (ref 11.5–15.5)
WBC: 8.3 10*3/uL (ref 4.0–10.5)
nRBC: 0 % (ref 0.0–0.2)

## 2019-05-04 LAB — BASIC METABOLIC PANEL
Anion gap: 9 (ref 5–15)
BUN: 10 mg/dL (ref 6–20)
CO2: 29 mmol/L (ref 22–32)
Calcium: 8.7 mg/dL — ABNORMAL LOW (ref 8.9–10.3)
Chloride: 99 mmol/L (ref 98–111)
Creatinine, Ser: 0.94 mg/dL (ref 0.61–1.24)
GFR calc Af Amer: >60 mL/min (ref >60)
GFR calc non Af Amer: >60 mL/min (ref >60)
Glucose, Bld: 98 mg/dL (ref 70–99)
Potassium: 3.6 mmol/L (ref 3.5–5.1)
Sodium: 137 mmol/L (ref 135–145)

## 2019-05-04 MED ORDER — ACETAMINOPHEN 325 MG PO TABS
650.0000 mg | ORAL_TABLET | Freq: Once | ORAL | Status: AC
  Administered 2019-05-04: 650 mg via ORAL
  Filled 2019-05-04: qty 2

## 2019-05-04 NOTE — ED Triage Notes (Signed)
Patient reports headache and mild body aches onset Saturday this week , no fever or chills . Respirations unlabored.

## 2019-05-05 LAB — POC SARS CORONAVIRUS 2 AG -  ED: SARS Coronavirus 2 Ag: POSITIVE — AB

## 2019-05-05 MED ORDER — KETOROLAC TROMETHAMINE 60 MG/2ML IM SOLN
30.0000 mg | Freq: Once | INTRAMUSCULAR | Status: AC
  Administered 2019-05-05: 30 mg via INTRAMUSCULAR
  Filled 2019-05-05: qty 2

## 2019-05-05 NOTE — ED Provider Notes (Signed)
Emergency Department Provider Note   I have reviewed the triage vital signs and the nursing notes.   HISTORY  Chief Complaint Headache   HPI Jeremy Zhang is a 24 y.o. male who presents to the emergency department today secondary to headache, chills and myalgias.  Patient states is been going on for few days.  Had a nonproductive cough that time as well.  Unknown whether or not he had a fever.  Patient with no other symptoms.  No known sick contacts.  States his manager told him that he should stay home and get checked out until he feels better.   No other associated or modifying symptoms.    History reviewed. No pertinent past medical history.  There are no problems to display for this patient.   History reviewed. No pertinent surgical history.  Current Outpatient Rx  . Order #: [id number] Class: Print  . Order #: [id number] Class: Print    Allergies Patient has no known allergies.  No family history on file.  Social History Social History   Tobacco Use  . Smoking status: Never Smoker  . Smokeless tobacco: Never Used  Substance Use Topics  . Alcohol use: No  . Drug use: Yes    Types: Marijuana    Review of Systems  All other systems negative except as documented in the HPI. All pertinent positives and negatives as reviewed in the HPI. ____________________________________________   PHYSICAL EXAM:  VITAL SIGNS: ED Triage Vitals  Enc Vitals Group     BP 05/04/19 2124 128/76     Pulse Rate 05/04/19 2124 90     Resp 05/04/19 2124 16     Temp 05/04/19 2124 98.4 F (36.9 C)     Temp Source 05/04/19 2124 Oral     SpO2 05/04/19 2124 98 %     Weight 05/04/19 2128 143 lb 4.8 oz (65 kg)     Height 05/04/19 2128 5\' 8"  (1.727 m)     Head Circumference --      Peak Flow --      Pain Score 05/04/19 2128 0     Pain Loc --      Pain Edu? --      Excl. in GC? --     Constitutional: Alert and oriented. Well appearing and in no acute distress. Eyes:  Conjunctivae are normal. PERRL. EOMI. Head: Atraumatic. Nose: No congestion/rhinnorhea. Mouth/Throat: Mucous membranes are moist.  Oropharynx non-erythematous. Neck: No stridor.  No meningeal signs.   Cardiovascular: Normal rate, regular rhythm. Good peripheral circulation. Grossly normal heart sounds.   Respiratory: Normal respiratory effort.  No retractions. Lungs CTAB. Gastrointestinal: Soft and nontender. No distention.  Musculoskeletal: No lower extremity tenderness nor edema. No gross deformities of extremities. Neurologic:  Normal speech and language. No gross focal neurologic deficits are appreciated.  Skin:  Skin is warm, dry and intact. No rash noted.   ____________________________________________   LABS (all labs ordered are listed, but only abnormal results are displayed)  Labs Reviewed  CBC WITH DIFFERENTIAL/PLATELET - Abnormal; Notable for the following components:      Result Value   RBC 4.14 (*)    RDW 11.4 (*)    Lymphs Abs 4.7 (*)    All other components within normal limits  BASIC METABOLIC PANEL - Abnormal; Notable for the following components:   Calcium 8.7 (*)    All other components within normal limits  POC SARS CORONAVIRUS 2 AG -  ED - Abnormal; Notable for the following components:  SARS Coronavirus 2 Ag POSITIVE (*)    All other components within normal limits   ____________________________________________  EKG   EKG Interpretation  Date/Time:    Ventricular Rate:    PR Interval:    QRS Duration:   QT Interval:    QTC Calculation:   R Axis:     Text Interpretation:         ____________________________________________  RADIOLOGY  No results found.  ____________________________________________   PROCEDURES  Procedure(s) performed:   Procedures   ____________________________________________   INITIAL IMPRESSION / ASSESSMENT AND PLAN / ED COURSE  Overall patient appears well. No nuchal rigidity or neck pain to suggest  meningitis. VS WNL. Overall appears well. Likely viral syndrome. Will treat symptomatically with likely discharge.   Pertinent labs & imaging results that were available during my care of the patient were reviewed by me and considered in my medical decision making (see chart for details).  A medical screening exam was performed and I feel the patient has had an appropriate workup for their chief complaint at this time and likelihood of emergent condition existing is low. They have been counseled on decision, discharge, follow up and which symptoms necessitate immediate return to the emergency department. They or their family verbally stated understanding and agreement with plan and discharged in stable condition.   ____________________________________________  FINAL CLINICAL IMPRESSION(S) / ED DIAGNOSES  Final diagnoses:  COVID-19     MEDICATIONS GIVEN DURING THIS VISIT:  Medications  acetaminophen (TYLENOL) tablet 650 mg (650 mg Oral Given 05/04/19 2134)  ketorolac (TORADOL) injection 30 mg (30 mg Intramuscular Given 05/05/19 0248)     NEW OUTPATIENT MEDICATIONS STARTED DURING THIS VISIT:  New Prescriptions   No medications on file    Note:  This note was prepared with assistance of Dragon voice recognition software. Occasional wrong-word or sound-a-like substitutions may have occurred due to the inherent limitations of voice recognition software.   Kelsee Preslar, Corene Cornea, MD 05/05/19 [phone]

## 2019-05-05 NOTE — ED Notes (Signed)
Patient verbalizes understanding of discharge instructions. Opportunity for questioning and answers were provided. Armband removed by staff, pt discharged from ED. Ambulated out to lobby  

## 2019-06-05 ENCOUNTER — Other Ambulatory Visit: Payer: Self-pay

## 2019-06-05 ENCOUNTER — Emergency Department (HOSPITAL_COMMUNITY)
Admission: EM | Admit: 2019-06-05 | Discharge: 2019-06-05 | Disposition: A | Discharge status: Home / Self Care | Payer: Self-pay | Attending: Emergency Medicine | Admitting: Emergency Medicine

## 2019-06-05 ENCOUNTER — Encounter (HOSPITAL_COMMUNITY): Payer: Self-pay | Admitting: Emergency Medicine

## 2019-06-05 DIAGNOSIS — Y999 Unspecified external cause status: Secondary | ICD-10-CM | POA: Insufficient documentation

## 2019-06-05 DIAGNOSIS — S29011A Strain of muscle and tendon of front wall of thorax, initial encounter: Secondary | ICD-10-CM | POA: Insufficient documentation

## 2019-06-05 DIAGNOSIS — X503XXA Overexertion from repetitive movements, initial encounter: Secondary | ICD-10-CM | POA: Insufficient documentation

## 2019-06-05 DIAGNOSIS — Y939 Activity, unspecified: Secondary | ICD-10-CM | POA: Insufficient documentation

## 2019-06-05 DIAGNOSIS — Y92009 Unspecified place in unspecified non-institutional (private) residence as the place of occurrence of the external cause: Secondary | ICD-10-CM | POA: Insufficient documentation

## 2019-06-05 NOTE — ED Triage Notes (Addendum)
Pt reports L sided chest pain x2 days, denies any other symptoms, pain worse with movement. Denies any specific trauma or injury. 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

## 2019-06-05 NOTE — ED Provider Notes (Signed)
[facility] EMERGENCY DEPARTMENT Provider Note   CSN: [id number] Arrival date & time: 06/05/19  1307     History Chief Complaint  Patient presents with  . Chest Pain    Jeremy Zhang is a 24 y.o. male.  Who presents emergency department with chest pain.  Patient complains of left-sided chest pain.  States it hurts when he moves his arms.  He does heavy lifting for work and works at.  Patient states that he has been out of work for the past 2 days.  He states "I am not sure if it is my muscle or my heart."  He denies shortness of breath, fevers or chills  HPI     History reviewed. No pertinent past medical history.  There are no problems to display for this patient.   History reviewed. No pertinent surgical history.     No family history on file.  Social History   Tobacco Use  . Smoking status: Never Smoker  . Smokeless tobacco: Never Used  Substance Use Topics  . Alcohol use: No  . Drug use: Yes    Types: Marijuana    Home Medications Prior to Admission medications   Medication Sig Start Date End Date Taking? Authorizing Provider  ibuprofen (ADVIL) 600 MG tablet Take 1 tablet (600 mg total) by mouth every 6 (six) hours as needed. 04/29/19   McDonald, [name], [name]  methocarbamol (ROBAXIN) 500 MG tablet Take 1 tablet (500 mg total) by mouth 2 (two) times daily. 04/29/19   McDonald, [name], [name]  diphenhydrAMINE (BENADRYL) 25 mg capsule Take 1 capsule (25 mg total) by mouth every 8 (eight) hours as needed for itching or allergies. Patient not taking: Reported on 02/25/2019 01/26/14 04/29/19  Junius Creamer, NP    Allergies    Patient has no known allergies.  Review of Systems   Review of Systems  Constitutional: Negative for chills and fever.  Respiratory: Negative for shortness of breath.   Cardiovascular: Positive for chest pain.    Physical Exam Updated Vital Signs BP 118/72   Pulse 72   Temp 98.6 F (37 C) (Oral)   Resp 12   SpO2 100%     Physical Exam Vitals and nursing note reviewed.  Constitutional:      General: He is not in acute distress.    Appearance: He is well-developed. He is not diaphoretic.  HENT:     Head: Normocephalic and atraumatic.  Eyes:     General: No scleral icterus.    Conjunctiva/sclera: Conjunctivae normal.  Cardiovascular:     Rate and Rhythm: Normal rate and regular rhythm.     Heart sounds: Normal heart sounds.  Pulmonary:     Effort: Pulmonary effort is normal. No respiratory distress.     Breath sounds: Normal breath sounds.  Chest:       Comments: Tender to palpation along the inferior aspect of the pectoral muscle, worse when raising of the left arm.  Tenderness along the pectoralis minor when lifting away the pectoralis major. Abdominal:     Palpations: Abdomen is soft.     Tenderness: There is no abdominal tenderness.  Musculoskeletal:     Cervical back: Normal range of motion and neck supple.  Skin:    General: Skin is warm and dry.  Neurological:     Mental Status: He is alert.  Psychiatric:        Behavior: Behavior normal.     ED Results / Procedures /  Treatments   Labs (all labs ordered are listed, but only abnormal results are displayed) Labs Reviewed - No data to display  EKG None  Radiology No results found.  Procedures Procedures (including critical care time)  Medications Ordered in ED Medications - No data to display  ED Course  I have reviewed the triage vital signs and the nursing notes.  Pertinent labs & imaging results that were available during my care of the patient were reviewed by me and considered in my medical decision making (see chart for details).    MDM Rules/Calculators/A&P                      Given the large differential diagnosis for Jeremy Zhang, the decision making in this case is of high complexity.  After evaluating all of the data points in this case, the presentation of Jeremy Zhang is NOT consistent with Acute  Coronary Syndrome (ACS) and/or myocardial ischemia, pulmonary embolism, aortic dissection; Borhaave's, significant arrythmia, pneumothorax, cardiac tamponade, or other emergent cardiopulmonary condition.  Further, the presentation of Jeremy Zhang is NOT consistent with pericarditis, myocarditis, cholecystitis, pancreatitis, mediastinitis, endocarditis, new valvular disease.  Additionally, the presentation of Jeremy Zhang NOT consistent with flail chest, cardiac contusion, ARDS, or significant intra-thoracic or intra-abdominal bleeding.  Moreover, this presentation is NOT consistent with pneumonia, sepsis, or pyelonephritis.  EKG shows sinus arrhythmia at a rate of 75 with large amplitude QRS complexes likely secondary to patient's body habitus. The patient has a   chest wall pain/ pectoralis strain   Strict return and follow-up precautions have been given by me personally or by detailed written instruction given verbally by nursing staff using the teach back method to the patient/family/caregiver(s).  Data Reviewed/Counseling: I have reviewed the patient's vital signs, nursing notes, and other relevant tests/information. I had a detailed discussion regarding the historical points, exam findings, and any diagnostic results supporting the discharge diagnosis. I also discussed the need for outpatient follow-up and the need to return to the ED if symptoms worsen or if there are any questions or concerns that arise at home.  Final Clinical Impression(s) / ED Diagnoses Final diagnoses:  Pectoralis muscle strain, initial encounter    Rx / DC Orders ED Discharge Orders    None       Arthor Captain, [name] 06/05/19 1648    Melene Plan, DO 06/06/19 2010

## 2019-06-05 NOTE — Discharge Instructions (Signed)
You have been diagnosed by your caregiver as having chest wall pain. °SEEK IMMEDIATE MEDICAL ATTENTION IF: °You develop a fever.  °Your chest pains become severe or intolerable.  °You develop new, unexplained symptoms (problems).  °You develop shortness of breath, nausea, vomiting, sweating or feel light headed.  °You develop a new cough or you cough up blood. ° °

## 2019-06-22 ENCOUNTER — Encounter (HOSPITAL_COMMUNITY): Payer: Self-pay

## 2019-06-22 ENCOUNTER — Other Ambulatory Visit: Payer: Self-pay

## 2019-06-22 ENCOUNTER — Emergency Department (HOSPITAL_COMMUNITY)
Admission: EM | Admit: 2019-06-22 | Discharge: 2019-06-22 | Disposition: A | Discharge status: Home / Self Care | Payer: Self-pay | Attending: Emergency Medicine | Admitting: Emergency Medicine

## 2019-06-22 DIAGNOSIS — R454 Irritability and anger: Secondary | ICD-10-CM | POA: Insufficient documentation

## 2019-06-22 DIAGNOSIS — R369 Urethral discharge, unspecified: Secondary | ICD-10-CM | POA: Insufficient documentation

## 2019-06-22 DIAGNOSIS — Z79899 Other long term (current) drug therapy: Secondary | ICD-10-CM | POA: Insufficient documentation

## 2019-06-22 DIAGNOSIS — R300 Dysuria: Secondary | ICD-10-CM | POA: Insufficient documentation

## 2019-06-22 LAB — URINALYSIS, ROUTINE W REFLEX MICROSCOPIC
Bilirubin Urine: NEGATIVE
Glucose, UA: NEGATIVE mg/dL
Hgb urine dipstick: NEGATIVE
Ketones, ur: NEGATIVE mg/dL
Nitrite: NEGATIVE
Protein, ur: NEGATIVE mg/dL
Specific Gravity, Urine: 1.015 (ref 1.005–1.030)
WBC, UA: >50 WBC/hpf — ABNORMAL HIGH (ref 0–5)
pH: 7 (ref 5.0–8.0)

## 2019-06-22 MED ORDER — CEFTRIAXONE SODIUM 500 MG IJ SOLR
500.0000 mg | Freq: Once | INTRAMUSCULAR | Status: AC
  Administered 2019-06-22: 500 mg via INTRAMUSCULAR
  Filled 2019-06-22: qty 500

## 2019-06-22 MED ORDER — LIDOCAINE HCL (PF) 1 % IJ SOLN
INTRAMUSCULAR | Status: AC
  Administered 2019-06-22: 5 mL
  Filled 2019-06-22: qty 5

## 2019-06-22 MED ORDER — DOXYCYCLINE HYCLATE 100 MG PO CAPS: 100.0000 mg | ORAL_CAPSULE | Freq: Two times a day (BID) | ORAL | 0 refills | Status: DC

## 2019-06-22 MED ORDER — DOXYCYCLINE HYCLATE 100 MG PO TABS
100.0000 mg | ORAL_TABLET | Freq: Once | ORAL | Status: AC
  Administered 2019-06-22: 100 mg via ORAL
  Filled 2019-06-22: qty 1

## 2019-06-22 NOTE — ED Triage Notes (Signed)
Pt reports 2 days of penile discharge and pain with urination. Requesting STD check.

## 2019-06-22 NOTE — Discharge Instructions (Signed)
Take Doxycycline twice a day for one week Do not have sex for 2 weeks Have all partners tested and treated If your test is abnormal, you will be called but you have been treated for Gonorrhea and Chlamydia today. You can also review your results on MyChart Practice safe sex and use a condom to prevent infection or unwanted pregnancy Follow up with the Health Department

## 2019-06-22 NOTE — ED Provider Notes (Signed)
[facility] EMERGENCY DEPARTMENT Provider Note   CSN: [id number] Arrival date & time: 06/22/19  0300     History Chief Complaint  Patient presents with  . Penile Discharge    Jeremy Zhang is a 24 y.o. male who presents with penile discharge. He states he's had penile discharge for the past 2 days. He is unable to characterize it. He states it hurts when he urinates as well. He has prior hx of STD and states it feels similar. He has sex with multiple male partners without protection. No testicular pain.  HPI     History reviewed. No pertinent past medical history.  There are no problems to display for this patient.   No past surgical history on file.     No family history on file.  Social History   Tobacco Use  . Smoking status: Never Smoker  . Smokeless tobacco: Never Used  Substance Use Topics  . Alcohol use: No  . Drug use: Yes    Types: Marijuana    Home Medications Prior to Admission medications   Medication Sig Start Date End Date Taking? Authorizing Provider  ibuprofen (ADVIL) 600 MG tablet Take 1 tablet (600 mg total) by mouth every 6 (six) hours as needed. 04/29/19   McDonald, [name], [name]  methocarbamol (ROBAXIN) 500 MG tablet Take 1 tablet (500 mg total) by mouth 2 (two) times daily. 04/29/19   McDonald, [name], [name]  diphenhydrAMINE (BENADRYL) 25 mg capsule Take 1 capsule (25 mg total) by mouth every 8 (eight) hours as needed for itching or allergies. Patient not taking: Reported on 02/25/2019 01/26/14 04/29/19  Junius Creamer, NP    Allergies    Patient has no known allergies.  Review of Systems   Review of Systems  Constitutional: Negative for fever.  Genitourinary: Positive for discharge and dysuria.    Physical Exam Updated Vital Signs BP 120/76   Pulse (!) 57   Temp 98.2 F (36.8 C)   Resp 18   SpO2 100%   Physical Exam Vitals and nursing note reviewed.  Constitutional:      General: He is not in acute distress.   Appearance: Normal appearance. He is well-developed. He is not ill-appearing.     Comments: Pt is irritable and rude  HENT:     Head: Normocephalic and atraumatic.  Eyes:     General: No scleral icterus.       Right eye: No discharge.        Left eye: No discharge.     Conjunctiva/sclera: Conjunctivae normal.     Pupils: Pupils are equal, round, and reactive to light.  Cardiovascular:     Rate and Rhythm: Normal rate.  Pulmonary:     Effort: Pulmonary effort is normal. No respiratory distress.  Abdominal:     General: There is no distension.  Genitourinary:    Comments: Deferred Musculoskeletal:     Cervical back: Normal range of motion.  Skin:    General: Skin is warm and dry.  Neurological:     Mental Status: He is alert and oriented to person, place, and time.  Psychiatric:        Behavior: Behavior normal.     ED Results / Procedures / Treatments   Labs (all labs ordered are listed, but only abnormal results are displayed) Labs Reviewed  URINALYSIS, ROUTINE W REFLEX MICROSCOPIC - Abnormal; Notable for the following components:      Result Value   Color, Urine STRAW (*)  APPearance CLOUDY (*)    Leukocytes,Ua LARGE (*)    WBC, UA >50 (*)    Bacteria, UA FEW (*)    All other components within normal limits  GC/CHLAMYDIA PROBE AMP ([place]) NOT AT [facility]    EKG None  Radiology No results found.  Procedures Procedures (including critical care time)  Medications Ordered in ED Medications  cefTRIAXone (ROCEPHIN) injection 500 mg (has no administration in time range)  doxycycline (VIBRA-TABS) tablet 100 mg (has no administration in time range)    ED Course  I have reviewed the triage vital signs and the nursing notes.  Pertinent labs & imaging results that were available during my care of the patient were reviewed by me and considered in my medical decision making (see chart for details).  24 year old male presents with penile discharge. He has hx of  STD and feels presentation is similar. UA has >50 WBC. Will send of G&C on urine. He was offered full STD screen and declined. He is upset because he has been waiting for over 2 hours and he has to pay for Doxycycline. Advised wear protection in future and have partners tested and treated.  MDM Rules/Calculators/A&P                       Final Clinical Impression(s) / ED Diagnoses Final diagnoses:  Penile discharge    Rx / DC Orders ED Discharge Orders    None       Bethel Born, [name] 06/22/19 0955    Virgina Norfolk, DO 06/22/19 [phone]

## 2019-07-03 ENCOUNTER — Emergency Department (HOSPITAL_COMMUNITY)
Admission: EM | Admit: 2019-07-03 | Discharge: 2019-07-03 | Disposition: A | Discharge status: Home / Self Care | Payer: Self-pay | Attending: Emergency Medicine | Admitting: Emergency Medicine

## 2019-07-03 ENCOUNTER — Encounter (HOSPITAL_COMMUNITY): Payer: Self-pay | Admitting: Emergency Medicine

## 2019-07-03 DIAGNOSIS — R1031 Right lower quadrant pain: Secondary | ICD-10-CM | POA: Insufficient documentation

## 2019-07-03 LAB — COMPREHENSIVE METABOLIC PANEL
ALT: 13 U/L (ref 0–44)
AST: 22 U/L (ref 15–41)
Albumin: 3.8 g/dL (ref 3.5–5.0)
Alkaline Phosphatase: 54 U/L (ref 38–126)
Anion gap: 6 (ref 5–15)
BUN: 12 mg/dL (ref 6–20)
CO2: 25 mmol/L (ref 22–32)
Calcium: 8.6 mg/dL — ABNORMAL LOW (ref 8.9–10.3)
Chloride: 107 mmol/L (ref 98–111)
Creatinine, Ser: 0.88 mg/dL (ref 0.61–1.24)
GFR calc Af Amer: >60 mL/min (ref >60)
GFR calc non Af Amer: >60 mL/min (ref >60)
Glucose, Bld: 95 mg/dL (ref 70–99)
Potassium: 4 mmol/L (ref 3.5–5.1)
Sodium: 138 mmol/L (ref 135–145)
Total Bilirubin: 1.5 mg/dL — ABNORMAL HIGH (ref 0.3–1.2)
Total Protein: 6.2 g/dL — ABNORMAL LOW (ref 6.5–8.1)

## 2019-07-03 LAB — CBC
HCT: 38 % — ABNORMAL LOW (ref 39.0–52.0)
Hemoglobin: 12.9 g/dL — ABNORMAL LOW (ref 13.0–17.0)
MCH: 33.6 pg (ref 26.0–34.0)
MCHC: 33.9 g/dL (ref 30.0–36.0)
MCV: 99 fL (ref 80.0–100.0)
Platelets: 260 10*3/uL (ref 150–400)
RBC: 3.84 MIL/uL — ABNORMAL LOW (ref 4.22–5.81)
RDW: 11.6 % (ref 11.5–15.5)
WBC: 6.1 10*3/uL (ref 4.0–10.5)
nRBC: 0 % (ref 0.0–0.2)

## 2019-07-03 LAB — LIPASE, BLOOD: Lipase: 38 U/L (ref 11–51)

## 2019-07-03 MED ORDER — SODIUM CHLORIDE 0.9% FLUSH: 3.0000 mL | Freq: Once | INTRAVENOUS | Status: DC

## 2019-07-03 MED ORDER — NAPROXEN 375 MG PO TABS: 375.0000 mg | ORAL_TABLET | Freq: Two times a day (BID) | ORAL | 0 refills | Status: DC | PRN

## 2019-07-03 NOTE — ED Notes (Signed)
Pt verbalized understanding of d/c instruction, medications and follow up. Pt declines any further questions, pt ambulatory to WR with steady gait, NAD

## 2019-07-03 NOTE — ED Triage Notes (Signed)
Pt reports a sharp pain in the lower part of his stomach for a few months, denies any associated symptoms such as n/v/d or urinary symptoms. Pt states he has been gaining and losing weight off and on.

## 2019-07-09 NOTE — ED Provider Notes (Signed)
[facility] EMERGENCY DEPARTMENT Provider Note   CSN: [id number] Arrival date & time: 07/03/19  4174     History Chief Complaint  Patient presents with  . Abdominal Pain    Jeremy Zhang is a 24 y.o. male.  HPI   24 year old male with right groin pain.  Pain is along the right inguinal crease.  Intermittent for months.  Symptoms seem to be worse with bending over and sometimes certain movements.  Has not felt any lesions or bulges.  No urinary complaints.  He has been treated for STDs since symptoms began but this pain did not specifically improve.  History reviewed. No pertinent past medical history.  There are no problems to display for this patient.   History reviewed. No pertinent surgical history.     No family history on file.  Social History   Tobacco Use  . Smoking status: Never Smoker  . Smokeless tobacco: Never Used  Substance Use Topics  . Alcohol use: No  . Drug use: Yes    Types: Marijuana    Home Medications Prior to Admission medications   Medication Sig Start Date End Date Taking? Authorizing Provider  doxycycline (VIBRAMYCIN) 100 MG capsule Take 1 capsule (100 mg total) by mouth 2 (two) times daily. 06/22/19   Bethel Born, [name]  ibuprofen (ADVIL) 600 MG tablet Take 1 tablet (600 mg total) by mouth every 6 (six) hours as needed. 04/29/19   McDonald, [name], [name]  methocarbamol (ROBAXIN) 500 MG tablet Take 1 tablet (500 mg total) by mouth 2 (two) times daily. 04/29/19   McDonald, [name], [name]  naproxen (NAPROSYN) 375 MG tablet Take 1 tablet (375 mg total) by mouth 2 (two) times daily as needed. 07/03/19   Raeford Razor, MD  diphenhydrAMINE (BENADRYL) 25 mg capsule Take 1 capsule (25 mg total) by mouth every 8 (eight) hours as needed for itching or allergies. Patient not taking: Reported on 02/25/2019 01/26/14 04/29/19  Earley Favor, NP    Allergies    Patient has no known allergies.  Review of Systems   Review of Systems All  systems reviewed and negative, other than as noted in HPI.  Physical Exam Updated Vital Signs BP 116/70 (BP Location: Right Arm)   Pulse 61   Temp 98 F (36.7 C) (Oral)   Resp 16   Ht 5\' 8"  (1.727 m)   Wt 52.2 kg   SpO2 99%   BMI 17.49 kg/m   Physical Exam Vitals and nursing note reviewed.  Constitutional:      General: He is not in acute distress.    Appearance: He is well-developed.  HENT:     Head: Normocephalic and atraumatic.  Eyes:     General:        Right eye: No discharge.        Left eye: No discharge.     Conjunctiva/sclera: Conjunctivae normal.  Cardiovascular:     Rate and Rhythm: Normal rate and regular rhythm.     Heart sounds: Normal heart sounds. No murmur. No friction rub. No gallop.   Pulmonary:     Effort: Pulmonary effort is normal. No respiratory distress.     Breath sounds: Normal breath sounds.  Abdominal:     General: There is no distension.     Palpations: Abdomen is soft.     Tenderness: There is no abdominal tenderness.  Musculoskeletal:        General: No tenderness.     Cervical back:  Neck supple.  Skin:    General: Skin is warm and dry.  Neurological:     Mental Status: He is alert.  Psychiatric:        Behavior: Behavior normal.        Thought Content: Thought content normal.     ED Results / Procedures / Treatments   Labs (all labs ordered are listed, but only abnormal results are displayed) Labs Reviewed  COMPREHENSIVE METABOLIC PANEL - Abnormal; Notable for the following components:      Result Value   Calcium 8.6 (*)    Total Protein 6.2 (*)    Total Bilirubin 1.5 (*)    All other components within normal limits  CBC - Abnormal; Notable for the following components:   RBC 3.84 (*)    Hemoglobin 12.9 (*)    HCT 38.0 (*)    All other components within normal limits  LIPASE, BLOOD    EKG None  Radiology No results found.  Procedures Procedures (including critical care time)  Medications Ordered in  ED Medications - No data to display  ED Course  I have reviewed the triage vital signs and the nursing notes.  Pertinent labs & imaging results that were available during my care of the patient were reviewed by me and considered in my medical decision making (see chart for details).    MDM Rules/Calculators/A&P                      24 year old male with right inguinal pain.  Possibly hernia although I cannot palpate one on exam.  Acacian symptoms are suggestive though.  I do not appreciate any inguinal adenopathy.  Testicular exam is unremarkable.  Plan symptomatic treatment with NSAIDs.  Avoid activities that exacerbate pain.  If it persists then he may need general surgical follow-up.  Final Clinical Impression(s) / ED Diagnoses Final diagnoses:  Right inguinal pain    Rx / DC Orders ED Discharge Orders         Ordered    naproxen (NAPROSYN) 375 MG tablet  2 times daily PRN     07/03/19 1112           Virgel Manifold, MD 07/09/19 1022

## 2019-07-15 ENCOUNTER — Emergency Department (HOSPITAL_COMMUNITY)
Admission: EM | Admit: 2019-07-15 | Discharge: 2019-07-15 | Disposition: A | Discharge status: Home / Self Care | Payer: Self-pay | Attending: Emergency Medicine | Admitting: Emergency Medicine

## 2019-07-15 ENCOUNTER — Encounter (HOSPITAL_COMMUNITY): Payer: Self-pay | Admitting: Emergency Medicine

## 2019-07-15 DIAGNOSIS — R1030 Lower abdominal pain, unspecified: Secondary | ICD-10-CM | POA: Insufficient documentation

## 2019-07-15 LAB — CBC WITH DIFFERENTIAL/PLATELET
Abs Immature Granulocytes: 0.03 10*3/uL (ref 0.00–0.07)
Basophils Absolute: 0 10*3/uL (ref 0.0–0.1)
Basophils Relative: 0 %
Eosinophils Absolute: 0.2 10*3/uL (ref 0.0–0.5)
Eosinophils Relative: 2 %
HCT: 41.8 % (ref 39.0–52.0)
Hemoglobin: 13.8 g/dL (ref 13.0–17.0)
Immature Granulocytes: 0 %
Lymphocytes Relative: 47 %
Lymphs Abs: 3.4 10*3/uL (ref 0.7–4.0)
MCH: 33.7 pg (ref 26.0–34.0)
MCHC: 33 g/dL (ref 30.0–36.0)
MCV: 102.2 fL — ABNORMAL HIGH (ref 80.0–100.0)
Monocytes Absolute: 0.4 10*3/uL (ref 0.1–1.0)
Monocytes Relative: 5 %
Neutro Abs: 3.5 10*3/uL (ref 1.7–7.7)
Neutrophils Relative %: 46 %
Platelets: 255 10*3/uL (ref 150–400)
RBC: 4.09 MIL/uL — ABNORMAL LOW (ref 4.22–5.81)
RDW: 11.6 % (ref 11.5–15.5)
WBC: 7.5 10*3/uL (ref 4.0–10.5)
nRBC: 0 % (ref 0.0–0.2)

## 2019-07-15 LAB — COMPREHENSIVE METABOLIC PANEL
ALT: 13 U/L (ref 0–44)
AST: 17 U/L (ref 15–41)
Albumin: 3.7 g/dL (ref 3.5–5.0)
Alkaline Phosphatase: 62 U/L (ref 38–126)
Anion gap: 7 (ref 5–15)
BUN: 11 mg/dL (ref 6–20)
CO2: 24 mmol/L (ref 22–32)
Calcium: 9 mg/dL (ref 8.9–10.3)
Chloride: 109 mmol/L (ref 98–111)
Creatinine, Ser: 0.79 mg/dL (ref 0.61–1.24)
GFR calc Af Amer: >60 mL/min (ref >60)
GFR calc non Af Amer: >60 mL/min (ref >60)
Glucose, Bld: 95 mg/dL (ref 70–99)
Potassium: 4.2 mmol/L (ref 3.5–5.1)
Sodium: 140 mmol/L (ref 135–145)
Total Bilirubin: 0.8 mg/dL (ref 0.3–1.2)
Total Protein: 6.4 g/dL — ABNORMAL LOW (ref 6.5–8.1)

## 2019-07-15 LAB — URINALYSIS, ROUTINE W REFLEX MICROSCOPIC
Bilirubin Urine: NEGATIVE
Glucose, UA: NEGATIVE mg/dL
Hgb urine dipstick: NEGATIVE
Ketones, ur: NEGATIVE mg/dL
Leukocytes,Ua: NEGATIVE
Nitrite: NEGATIVE
Protein, ur: NEGATIVE mg/dL
Specific Gravity, Urine: 1.025 (ref 1.005–1.030)
pH: 5 (ref 5.0–8.0)

## 2019-07-15 LAB — LIPASE, BLOOD: Lipase: 30 U/L (ref 11–51)

## 2019-07-15 MED ORDER — NAPROXEN 375 MG PO TABS: 375.0000 mg | ORAL_TABLET | Freq: Two times a day (BID) | ORAL | 0 refills | Status: AC | PRN

## 2019-07-15 NOTE — ED Provider Notes (Signed)
[facility] EMERGENCY DEPARTMENT Provider Note   CSN: [id number] Arrival date & time: 07/15/19  2458     History Chief Complaint  Patient presents with  . Abdominal Pain    Jeremy Zhang is a 24 y.o. male who presents for evaluation of lower abdominal pain that has been intermittently occurring over the last 2 months.  He states he came to the emergency department today because he felt like the pain is getting worse.  He states that most the time is in the suprapubic region.  He states that he feels like it is brought on by movement.  He states that the wounds.  He had one episode of vomiting yesterday but has otherwise been able to eat and drink since then.  He has not taken medications for the pain.  He states he has not sought evaluation for this pain.  He denies any fevers, chest pain, difficulty breathing, dysuria, hematuria, penile/testicular pain or swelling.   The history is provided by the patient.       History reviewed. No pertinent past medical history.  There are no problems to display for this patient.   History reviewed. No pertinent surgical history.     History reviewed. No pertinent family history.  Social History   Tobacco Use  . Smoking status: Never Smoker  . Smokeless tobacco: Never Used  Substance Use Topics  . Alcohol use: No  . Drug use: Yes    Types: Marijuana    Home Medications Prior to Admission medications   Medication Sig Start Date End Date Taking? Authorizing Provider  ibuprofen (ADVIL) 600 MG tablet Take 1 tablet (600 mg total) by mouth every 6 (six) hours as needed. Patient not taking: Reported on 07/15/2019 04/29/19   McDonald, Pedro Earls A, [name]  methocarbamol (ROBAXIN) 500 MG tablet Take 1 tablet (500 mg total) by mouth 2 (two) times daily. Patient not taking: Reported on 07/15/2019 04/29/19   McDonald, Pedro Earls A, [name]  naproxen (NAPROSYN) 375 MG tablet Take 1 tablet (375 mg total) by mouth 2 (two) times daily as needed. 07/15/19    Maxwell Caul, [name]  diphenhydrAMINE (BENADRYL) 25 mg capsule Take 1 capsule (25 mg total) by mouth every 8 (eight) hours as needed for itching or allergies. Patient not taking: Reported on 02/25/2019 01/26/14 04/29/19  Earley Favor, NP    Allergies    Patient has no known allergies.  Review of Systems   Review of Systems  Constitutional: Negative for fever.  Respiratory: Negative for cough and shortness of breath.   Cardiovascular: Negative for chest pain.  Gastrointestinal: Positive for abdominal pain and nausea. Negative for vomiting.  Genitourinary: Negative for dysuria and hematuria.  Neurological: Negative for headaches.  All other systems reviewed and are negative.   Physical Exam Updated Vital Signs BP 95/81   Pulse (!) 49   Temp 97.9 F (36.6 C) (Oral)   Resp 16   SpO2 100%   Physical Exam Vitals and nursing note reviewed. Exam conducted with a chaperone present.  Constitutional:      Appearance: Normal appearance. He is well-developed.  HENT:     Head: Normocephalic and atraumatic.  Eyes:     General: Lids are normal.     Conjunctiva/sclera: Conjunctivae normal.     Pupils: Pupils are equal, round, and reactive to light.  Cardiovascular:     Rate and Rhythm: Normal rate and regular rhythm.     Pulses: Normal pulses.     Heart  sounds: Normal heart sounds. No murmur. No friction rub. No gallop.   Pulmonary:     Effort: Pulmonary effort is normal.     Breath sounds: Normal breath sounds.  Abdominal:     Palpations: Abdomen is soft. Abdomen is not rigid.     Tenderness: There is abdominal tenderness in the suprapubic area. There is no right CVA tenderness, left CVA tenderness or guarding.     Hernia: There is no hernia in the right inguinal area.    Genitourinary:    Penis: Normal and circumcised.      Testes:        Right: Tenderness or swelling not present.        Left: Tenderness or swelling not present.     Comments: The exam was performed with a  chaperone present. Normal male genitalia. No evidence of rash, ulcers or lesions. No inguinal hernia noted bilatearlly.  Musculoskeletal:        General: Normal range of motion.     Cervical back: Full passive range of motion without pain.  Skin:    General: Skin is warm and dry.     Capillary Refill: Capillary refill takes less than 2 seconds.  Neurological:     Mental Status: He is alert and oriented to person, place, and time.  Psychiatric:        Speech: Speech normal.     ED Results / Procedures / Treatments   Labs (all labs ordered are listed, but only abnormal results are displayed) Labs Reviewed  COMPREHENSIVE METABOLIC PANEL - Abnormal; Notable for the following components:      Result Value   Total Protein 6.4 (*)    All other components within normal limits  CBC WITH DIFFERENTIAL/PLATELET - Abnormal; Notable for the following components:   RBC 4.09 (*)    MCV 102.2 (*)    All other components within normal limits  URINALYSIS, ROUTINE W REFLEX MICROSCOPIC  LIPASE, BLOOD    EKG None  Radiology No results found.  Procedures Procedures (including critical care time)  Medications Ordered in ED Medications - No data to display  ED Course  I have reviewed the triage vital signs and the nursing notes.  Pertinent labs & imaging results that were available during my care of the patient were reviewed by me and considered in my medical decision making (see chart for details).    MDM Rules/Calculators/A&P                      24 year old male who presents for evaluation of abdominal pain x2 months.  He states that he has not sought evaluation for this pain.  Came in today because he felt like the pain is getting worse.  No fevers.  Had 1 episode of vomiting yesterday.  No urinary complaints.  No chest pain, difficulty breathing. Patient is afebrile, non-toxic appearing, sitting comfortably on examination table. Vital signs reviewed and stable.  On exam, mild  tenderness noted suprapubic region.  GU exam normal.  Doubt infectious etiology given completion of symptoms.  Question if there is hernia although there is no palpable hernia noted.  Plan for labs, urine.  Lipase is normal.  CBC shows no leukocytosis.  Hemoglobin stable.  CMP is unremarkable.  UA is unremarkable.  On my exam, I did not palpate an obvious hernia.  CT scan ordered to see if there is any other etiology of his symptoms.  Patient informed RN that he did not want  the CT scan.  I went discussed with patient.  He states that he knows what is causing his pain and that he has been told he has a hernia there before.  He states he has not followed up with any general surgeon after getting evaluated.  He states that he does not wish to have a CT scan and would just like a referral and some naproxen.  Patient is hemodynamically stable.  He appears clinically sober and steady.  We will plan to give him referral to general surgery. At this time, patient exhibits no emergent life-threatening condition that require further evaluation in ED or admission. Patient had ample opportunity for questions and discussion. All patient's questions were answered with full understanding. Strict return precautions discussed. Patient expresses understanding and agreement to plan.   Portions of this note were generated with [employer]. Dictation errors may occur despite best attempts at proofreading.   Final Clinical Impression(s) / ED Diagnoses Final diagnoses:  Lower abdominal pain    Rx / DC Orders ED Discharge Orders         Ordered    naproxen (NAPROSYN) 375 MG tablet  2 times daily PRN     07/15/19 1346           Volanda Napoleon, [name] 07/15/19 1416    Lennice Sites, DO 07/15/19 1421

## 2019-07-15 NOTE — Discharge Instructions (Signed)
Take Naprosyn as directed.   Follow up with the general surgeon as directed.   Return to the Emergency Department immediately if you experience any worsening abdominal pain, fever, persistent nausea and vomiting, inability keep any food down, pain with urination, blood in your urine or any other worsening or concerning symptoms.

## 2019-07-15 NOTE — ED Notes (Signed)
Pt refused vitals at time of discharge. Pt alert and oriented x4. Ambulatory. No c/o of pain at time of discharge. D/c papers given to pt, no further questions or concerns verbalized.

## 2019-07-15 NOTE — ED Notes (Addendum)
Pt refused IV, refused CT scan. Pt became agitated and aggressive with this RN when this RN attempted to clarify pts refusal. Lillia Abed PA notified and went to speak with pt.

## 2019-07-15 NOTE — ED Triage Notes (Signed)
Pt here with lower abd pain , no n/v no urinary symptoms was seen for same about 3 weeks ago

## 2019-08-05 ENCOUNTER — Emergency Department (HOSPITAL_COMMUNITY)
Admission: EM | Admit: 2019-08-05 | Discharge: 2019-08-06 | Disposition: A | Discharge status: Home / Self Care | Payer: Self-pay | Attending: Emergency Medicine | Admitting: Emergency Medicine

## 2019-08-05 ENCOUNTER — Other Ambulatory Visit: Payer: Self-pay

## 2019-08-05 ENCOUNTER — Encounter (HOSPITAL_COMMUNITY): Payer: Self-pay | Admitting: Emergency Medicine

## 2019-08-05 DIAGNOSIS — Z5321 Procedure and treatment not carried out due to patient leaving prior to being seen by health care provider: Secondary | ICD-10-CM | POA: Insufficient documentation

## 2019-08-05 DIAGNOSIS — Z202 Contact with and (suspected) exposure to infections with a predominantly sexual mode of transmission: Secondary | ICD-10-CM | POA: Insufficient documentation

## 2019-08-05 LAB — URINALYSIS, ROUTINE W REFLEX MICROSCOPIC
Bacteria, UA: NONE SEEN
Bilirubin Urine: NEGATIVE
Glucose, UA: NEGATIVE mg/dL
Hgb urine dipstick: NEGATIVE
Ketones, ur: NEGATIVE mg/dL
Nitrite: NEGATIVE
Protein, ur: 100 mg/dL — AB
Specific Gravity, Urine: 1.029 (ref 1.005–1.030)
pH: 7 (ref 5.0–8.0)

## 2019-08-05 MED ORDER — SODIUM CHLORIDE 0.9% FLUSH: 3.0000 mL | Freq: Once | INTRAVENOUS | Status: DC

## 2019-08-05 NOTE — ED Triage Notes (Signed)
Pt requesting STD screen, c/o penile discharge and pain.

## 2019-08-05 NOTE — ED Notes (Signed)
Pt called for triage, no answer

## 2019-08-06 ENCOUNTER — Encounter (HOSPITAL_COMMUNITY): Payer: Self-pay | Admitting: Emergency Medicine

## 2019-08-06 ENCOUNTER — Emergency Department (HOSPITAL_COMMUNITY)
Admission: EM | Admit: 2019-08-06 | Discharge: 2019-08-06 | Disposition: A | Discharge status: Home / Self Care | Payer: Self-pay | Attending: Emergency Medicine | Admitting: Emergency Medicine

## 2019-08-06 ENCOUNTER — Other Ambulatory Visit: Payer: Self-pay

## 2019-08-06 DIAGNOSIS — A64 Unspecified sexually transmitted disease: Secondary | ICD-10-CM | POA: Insufficient documentation

## 2019-08-06 DIAGNOSIS — R369 Urethral discharge, unspecified: Secondary | ICD-10-CM

## 2019-08-06 LAB — URINALYSIS, ROUTINE W REFLEX MICROSCOPIC
Bilirubin Urine: NEGATIVE
Glucose, UA: NEGATIVE mg/dL
Hgb urine dipstick: NEGATIVE
Ketones, ur: NEGATIVE mg/dL
Nitrite: NEGATIVE
Protein, ur: 100 mg/dL — AB
Specific Gravity, Urine: 1.025 (ref 1.005–1.030)
WBC, UA: >50 WBC/hpf — ABNORMAL HIGH (ref 0–5)
pH: 8 (ref 5.0–8.0)

## 2019-08-06 LAB — WET PREP, GENITAL
Clue Cells Wet Prep HPF POC: NONE SEEN
Sperm: NONE SEEN
Trich, Wet Prep: NONE SEEN
Yeast Wet Prep HPF POC: NONE SEEN

## 2019-08-06 MED ORDER — DOXYCYCLINE HYCLATE 100 MG PO TABS
100.0000 mg | ORAL_TABLET | Freq: Once | ORAL | Status: AC
  Administered 2019-08-06: 100 mg via ORAL
  Filled 2019-08-06: qty 1

## 2019-08-06 MED ORDER — LIDOCAINE HCL (PF) 1 % IJ SOLN
1.0000 mL | Freq: Once | INTRAMUSCULAR | Status: AC
  Administered 2019-08-06: 1 mL
  Filled 2019-08-06: qty 5

## 2019-08-06 MED ORDER — CEFTRIAXONE SODIUM 500 MG IJ SOLR
500.0000 mg | Freq: Once | INTRAMUSCULAR | Status: AC
  Administered 2019-08-06: 500 mg via INTRAMUSCULAR
  Filled 2019-08-06: qty 500

## 2019-08-06 MED ORDER — DOXYCYCLINE HYCLATE 100 MG PO CAPS
100.0000 mg | ORAL_CAPSULE | Freq: Two times a day (BID) | ORAL | 0 refills | Status: DC
Start: 2019-08-06 — End: 2019-09-21

## 2019-08-06 NOTE — ED Notes (Signed)
Called for recheck VS x3, no response 

## 2019-08-06 NOTE — Discharge Instructions (Signed)
Is very important to complete the entire course of antibiotics that I prescribed you.  Please take them as prescribed and has abstained from sex for the next 2 weeks.  Make sure that you and your partner are both tested before your resuming sexual activity.  Please use actual protection such as a condom for every sexual encounter.

## 2019-08-06 NOTE — ED Notes (Signed)
Pt verbalized understanding.

## 2019-08-06 NOTE — ED Provider Notes (Addendum)
[facility] EMERGENCY DEPARTMENT Provider Note   CSN: [id number] Arrival date & time: 08/06/19  4098     History No chief complaint on file.   Jeremy Zhang is a 24 y.o. male.  HPI  Patient is a 24 year old male with past medical history significant for chlamydia 02/13/2019. He has no other past medical history. He states that he is sexually active with his girlfriend. He states that he has had penile discharge that is green in color and malodorous for 3 days. Denies any fevers, chills, nausea or vomiting or abdominal pain. He denies any dysuria frequency urgency.  On my review of EMR it appears that patient was treated for same condition 06/22/2019. He was treated with Rocephin and doxycycline. He states that he took the entire course of antibiotics. He states that his symptoms completely resolved. He states that he does not use a condom or other barrier protection with his girlfriend.  Patient denies any testicular pain, no history of torsion, discomfort was all gradual in onset. Associated with the penile discharge.    History reviewed. No pertinent past medical history.  There are no problems to display for this patient.   History reviewed. No pertinent surgical history.     No family history on file.  Social History   Tobacco Use  . Smoking status: Never Smoker  . Smokeless tobacco: Never Used  Substance Use Topics  . Alcohol use: No  . Drug use: Yes    Types: Marijuana    Home Medications Prior to Admission medications   Medication Sig Start Date End Date Taking? Authorizing Provider  doxycycline (VIBRAMYCIN) 100 MG capsule Take 1 capsule (100 mg total) by mouth 2 (two) times daily. 08/06/19   Gailen Shelter, PA  ibuprofen (ADVIL) 600 MG tablet Take 1 tablet (600 mg total) by mouth every 6 (six) hours as needed. Patient not taking: Reported on 07/15/2019 04/29/19   McDonald, Pedro Earls A, [name]  methocarbamol (ROBAXIN) 500 MG tablet Take 1 tablet (500 mg  total) by mouth 2 (two) times daily. Patient not taking: Reported on 07/15/2019 04/29/19   McDonald, Pedro Earls A, [name]  naproxen (NAPROSYN) 375 MG tablet Take 1 tablet (375 mg total) by mouth 2 (two) times daily as needed. 07/15/19   Maxwell Caul, [name]  diphenhydrAMINE (BENADRYL) 25 mg capsule Take 1 capsule (25 mg total) by mouth every 8 (eight) hours as needed for itching or allergies. Patient not taking: Reported on 02/25/2019 01/26/14 04/29/19  Earley Favor, NP    Allergies    Patient has no known allergies.  Review of Systems   Review of Systems  Constitutional: Negative for chills and fever.  HENT: Negative for congestion.   Respiratory: Negative for shortness of breath.   Cardiovascular: Negative for chest pain.  Gastrointestinal: Negative for abdominal pain.  Genitourinary: Positive for discharge. Negative for testicular pain.  Musculoskeletal: Negative for neck pain.    Physical Exam Updated Vital Signs BP 115/61 (BP Location: Right Arm)   Pulse 60   Temp 98 F (36.7 C) (Oral)   Resp 18   Ht 5\' 8"  (1.727 m)   Wt 59 kg   SpO2 98%   BMI 19.77 kg/m   Physical Exam Vitals and nursing note reviewed.  Constitutional:      General: He is not in acute distress.    Appearance: Normal appearance. He is not ill-appearing.  HENT:     Head: Normocephalic and atraumatic.  Eyes:  General: No scleral icterus.       Right eye: No discharge.        Left eye: No discharge.     Conjunctiva/sclera: Conjunctivae normal.  Pulmonary:     Effort: Pulmonary effort is normal.     Breath sounds: No stridor.  Abdominal:     Tenderness: There is no abdominal tenderness. There is no right CVA tenderness, left CVA tenderness, guarding or rebound.  Genitourinary:    Testes: Normal.     Comments: Scant penile discharge present.  Yellow/green in color Skin:    General: Skin is warm and dry.     Capillary Refill: Capillary refill takes less than 2 seconds.  Neurological:     Mental Status:  He is alert and oriented to person, place, and time. Mental status is at baseline.     ED Results / Procedures / Treatments   Labs (all labs ordered are listed, but only abnormal results are displayed) Labs Reviewed  WET PREP, GENITAL - Abnormal; Notable for the following components:      Result Value   WBC, Wet Prep HPF POC MANY (*)    All other components within normal limits  URINALYSIS, ROUTINE W REFLEX MICROSCOPIC - Abnormal; Notable for the following components:   APPearance CLOUDY (*)    Protein, ur 100 (*)    Leukocytes,Ua LARGE (*)    WBC, UA >50 (*)    Bacteria, UA FEW (*)    All other components within normal limits  GC/CHLAMYDIA PROBE AMP ([place]) NOT AT [facility]    EKG None  Radiology No results found.  Procedures Procedures (including critical care time)  Medications Ordered in ED Medications  cefTRIAXone (ROCEPHIN) injection 500 mg (500 mg Intramuscular Given 08/06/19 1200)  lidocaine (PF) (XYLOCAINE) 1 % injection 1 mL (1 mL Other Given 08/06/19 1200)  doxycycline (VIBRA-TABS) tablet 100 mg (100 mg Oral Given 08/06/19 1200)    ED Course  I have reviewed the triage vital signs and the nursing notes.  Pertinent labs & imaging results that were available during my care of the patient were reviewed by me and considered in my medical decision making (see chart for details).    MDM Rules/Calculators/A&P                          Patient presents today with several days of penile discharge.  No abdominal pain, nausea, vomiting, fevers or chills.  Physical exam is unremarkable apart from some scant yellowish-green penile discharge expressed from the end of the penile urethra.  Patient treated with 1 g of Rocephin IM 1 week of doxycycline given 1 dose here in ED.  GC chlamydia test pending.  I called microbiology to confirm that it was received by the lab.  Urinalysis shows large leukocytes WBCs and bacteria present.  This consistent with STD.  No hematuria.  Wet  prep negative for Trichomonas  Patient understands importance of follow-up with health department for further testing.  He will be sexually abstinent for 2 weeks and use barrier protection.    Final Clinical Impression(s) / ED Diagnoses Final diagnoses:  STD (male)  Penile discharge    Rx / DC Orders ED Discharge Orders         Ordered    doxycycline (VIBRAMYCIN) 100 MG capsule  2 times daily     Discontinue  Reprint     08/06/19 1246  Gailen Shelter, [place] 08/06/19 1259    Solon Augusta [place], [place] 08/06/19 1300    Blane Ohara, MD 08/06/19 1556

## 2019-08-06 NOTE — ED Triage Notes (Signed)
Pt back today with c/o penile discharge along with pain

## 2019-08-07 LAB — GC/CHLAMYDIA PROBE AMP (~~LOC~~) NOT AT ARMC
Chlamydia: NEGATIVE
Comment: NEGATIVE
Comment: NORMAL
Neisseria Gonorrhea: POSITIVE — AB

## 2019-09-21 ENCOUNTER — Encounter (HOSPITAL_COMMUNITY): Payer: Self-pay

## 2019-09-21 ENCOUNTER — Other Ambulatory Visit: Payer: Self-pay

## 2019-09-21 ENCOUNTER — Emergency Department (HOSPITAL_COMMUNITY)
Admission: EM | Admit: 2019-09-21 | Discharge: 2019-09-21 | Disposition: A | Discharge status: Home / Self Care | Payer: Self-pay | Attending: Emergency Medicine | Admitting: Emergency Medicine

## 2019-09-21 DIAGNOSIS — R360 Urethral discharge without blood: Secondary | ICD-10-CM

## 2019-09-21 DIAGNOSIS — R369 Urethral discharge, unspecified: Secondary | ICD-10-CM | POA: Insufficient documentation

## 2019-09-21 MED ORDER — DOXYCYCLINE HYCLATE 100 MG PO CAPS
100.0000 mg | ORAL_CAPSULE | Freq: Two times a day (BID) | ORAL | 0 refills | Status: AC
Start: 2019-09-21 — End: 2019-09-28

## 2019-09-21 MED ORDER — CEFTRIAXONE SODIUM 1 G IJ SOLR
500.0000 mg | Freq: Once | INTRAMUSCULAR | Status: AC
  Administered 2019-09-21: 500 mg via INTRAMUSCULAR
  Filled 2019-09-21: qty 10

## 2019-09-21 MED ORDER — STERILE WATER FOR INJECTION IJ SOLN
INTRAMUSCULAR | Status: AC
  Filled 2019-09-21: qty 10

## 2019-09-21 NOTE — ED Triage Notes (Signed)
Pt reports needs STD check.   C/o penile discharge

## 2019-09-21 NOTE — Discharge Instructions (Addendum)
You have been treated presumptively today for gonorrhea and you have been prescribed medication to cover for chlamydia.  Please take your antibiotic, as prescribed.  Take with food.  You have been tested today for gonorrhea and chlamydia. These results will be available in approximately 3 days. You may check your MyChart account for results. Please inform all sexual partners of positive results and that they should be tested and treated as well.  Please wait 2 weeks and be sure that you and your partners are symptom free before returning to sexual activity. Please use protection with every sexual encounter.  Follow Up: Please followup with your primary doctor in 3 days for discussion of your diagnoses and further evaluation after today's visit; if you do not have a primary care doctor use the resource guide provided to find one; Please return to the ER for worsening symptoms, high fevers or persistent vomiting.  I also encourage you to go to the [facility] Department in [place], [place] for continued STI assessment and treatment. [email or web address] Address: [address] [place], [place] [address] Hours:  Open ? Closes 5PM Phone: [phone]

## 2019-09-21 NOTE — ED Provider Notes (Signed)
[facility]-EMERGENCY DEPT Provider Note   CSN: [id number] Arrival date & time: 09/21/19  1322     History No chief complaint on file.   Jeremy Zhang is a 24 y.o. male with no significant past medical history presents the ED with complaints of penile discharge.  I reviewed patient's medical record and he has been evaluated on 02/13/2019, 06/22/2019, and 08/06/2019 for similar symptoms.  He was positive for gonorrhea during these prior encounters.  On my examination, patient states that he has been having penile discharge for approximately 1 week.  He is accompanied by his girlfriend who tested positive for chlamydia and gonorrhea.  She was negative for trichomoniasis.  Patient is frustrated and agitated on examination.  Patient denies any fevers or chills, abdominal pain, rashes, testicular pain or discomfort, testicular swelling, dysuria, changes in bowel habits, or pain with defecation.  Patient states that he waited the full 2 weeks after being treated on 08/06/2019 before engaging in any sexual intercourse.  He states that he is in a monogamous relationship with his girlfriend and suspects that she has been cheating.  Patient then states that he does not want a male to do his GU exam.  He continues to have unprotected sexual intercourse.  HPI     History reviewed. No pertinent past medical history.  There are no problems to display for this patient.   History reviewed. No pertinent surgical history.     History reviewed. No pertinent family history.  Social History   Tobacco Use  . Smoking status: Never Smoker  . Smokeless tobacco: Never Used  Substance Use Topics  . Alcohol use: No  . Drug use: Yes    Types: Marijuana    Home Medications Prior to Admission medications   Medication Sig Start Date End Date Taking? Authorizing Provider  doxycycline (VIBRAMYCIN) 100 MG capsule Take 1 capsule (100 mg total) by mouth 2 (two) times daily for 7 days. 09/21/19  09/28/19  Lorelee New, [name]  ibuprofen (ADVIL) 600 MG tablet Take 1 tablet (600 mg total) by mouth every 6 (six) hours as needed. Patient not taking: Reported on 07/15/2019 04/29/19   McDonald, Pedro Earls A, [name]  methocarbamol (ROBAXIN) 500 MG tablet Take 1 tablet (500 mg total) by mouth 2 (two) times daily. Patient not taking: Reported on 07/15/2019 04/29/19   McDonald, Pedro Earls A, [name]  naproxen (NAPROSYN) 375 MG tablet Take 1 tablet (375 mg total) by mouth 2 (two) times daily as needed. 07/15/19   Maxwell Caul, [name]  diphenhydrAMINE (BENADRYL) 25 mg capsule Take 1 capsule (25 mg total) by mouth every 8 (eight) hours as needed for itching or allergies. Patient not taking: Reported on 02/25/2019 01/26/14 04/29/19  Earley Favor, NP    Allergies    Patient has no known allergies.  Review of Systems   Review of Systems  All other systems reviewed and are negative.   Physical Exam Updated Vital Signs BP 108/80   Pulse 70   Temp 98.8 F (37.1 C) (Oral)   Resp 17   Wt 56.7 kg   SpO2 98%   BMI 19.02 kg/m   Physical Exam Vitals and nursing note reviewed. Exam conducted with a chaperone present.  Constitutional:      General: He is not in acute distress.    Appearance: He is not ill-appearing.     Comments: Agitated.  HENT:     Head: Normocephalic and atraumatic.  Eyes:     General: No  scleral icterus.    Conjunctiva/sclera: Conjunctivae normal.  Cardiovascular:     Rate and Rhythm: Normal rate.     Pulses: Normal pulses.  Pulmonary:     Effort: Pulmonary effort is normal. No respiratory distress.  Abdominal:     General: Abdomen is flat. There is no distension.     Palpations: Abdomen is soft.     Tenderness: There is no abdominal tenderness.  Genitourinary:    Comments: Declined GU exam. Skin:    General: Skin is dry.  Neurological:     Mental Status: He is alert.     GCS: GCS eye subscore is 4. GCS verbal subscore is 5. GCS motor subscore is 6.  Psychiatric:        Mood and  Affect: Mood normal.        Behavior: Behavior normal.        Thought Content: Thought content normal.     ED Results / Procedures / Treatments   Labs (all labs ordered are listed, but only abnormal results are displayed) Labs Reviewed  GC/CHLAMYDIA PROBE AMP ([place]) NOT AT [facility]    EKG None  Radiology No results found.  Procedures Procedures (including critical care time)  Medications Ordered in ED Medications  cefTRIAXone (ROCEPHIN) injection 500 mg (has no administration in time range)    ED Course  I have reviewed the triage vital signs and the nursing notes.  Pertinent labs & imaging results that were available during my care of the patient were reviewed by me and considered in my medical decision making (see chart for details).    MDM Rules/Calculators/A&P                          Patient is afebrile without abdominal tenderness, abdominal pain or painful bowel movements to indicate prostatitis.  Patient declined GU exam.  STD cultures obtained including gonorrhea and chlamydia.  Patient declines blood testing as he had already recently received HIV and RPR testing.  Patient to be discharged with instructions to follow up with PCP. Discussed importance of using protection when sexually active. Pt understands that they have GC/Chlamydia cultures pending and that they will need to inform all sexual partners if results return positive. Patient has been treated prophylactically with Rocephin and prescribed doxycycline.   Strict ED return precautions discussed.   Final Clinical Impression(s) / ED Diagnoses Final diagnoses:  Penile discharge, without blood    Rx / DC Orders ED Discharge Orders         Ordered    doxycycline (VIBRAMYCIN) 100 MG capsule  2 times daily     Discontinue  Reprint     09/21/19 1558           Lorelee New, [name] 09/21/19 1558    Wynetta Fines, MD 09/21/19 [phone]

## 2019-09-22 LAB — GC/CHLAMYDIA PROBE AMP (~~LOC~~) NOT AT ARMC
Chlamydia: NEGATIVE
Comment: NEGATIVE
Comment: NORMAL
Neisseria Gonorrhea: POSITIVE — AB

## 2019-09-28 LAB — RPR: RPR Ser Ql: NONREACTIVE — AB

## 2020-06-07 ENCOUNTER — Emergency Department (HOSPITAL_COMMUNITY): Payer: No Typology Code available for payment source

## 2020-06-07 ENCOUNTER — Emergency Department (HOSPITAL_COMMUNITY)
Admission: EM | Admit: 2020-06-07 | Discharge: 2020-06-07 | Disposition: A | Discharge status: Home / Self Care | Payer: No Typology Code available for payment source | Attending: Emergency Medicine | Admitting: Emergency Medicine

## 2020-06-07 ENCOUNTER — Other Ambulatory Visit: Payer: Self-pay

## 2020-06-07 ENCOUNTER — Encounter (HOSPITAL_COMMUNITY): Payer: Self-pay

## 2020-06-07 DIAGNOSIS — M549 Dorsalgia, unspecified: Secondary | ICD-10-CM | POA: Diagnosis not present

## 2020-06-07 DIAGNOSIS — M542 Cervicalgia: Secondary | ICD-10-CM | POA: Diagnosis not present

## 2020-06-07 DIAGNOSIS — Y92410 Unspecified street and highway as the place of occurrence of the external cause: Secondary | ICD-10-CM | POA: Insufficient documentation

## 2020-06-07 DIAGNOSIS — M79642 Pain in left hand: Secondary | ICD-10-CM | POA: Insufficient documentation

## 2020-06-07 DIAGNOSIS — M25561 Pain in right knee: Secondary | ICD-10-CM | POA: Insufficient documentation

## 2020-06-07 DIAGNOSIS — R519 Headache, unspecified: Secondary | ICD-10-CM | POA: Diagnosis not present

## 2020-06-07 DIAGNOSIS — R0781 Pleurodynia: Secondary | ICD-10-CM | POA: Insufficient documentation

## 2020-06-07 DIAGNOSIS — R531 Weakness: Secondary | ICD-10-CM | POA: Insufficient documentation

## 2020-06-07 MED ORDER — METHOCARBAMOL 500 MG PO TABS: 500.0000 mg | ORAL_TABLET | Freq: Two times a day (BID) | ORAL | 0 refills | Status: AC

## 2020-06-07 MED ORDER — HYDROCODONE-ACETAMINOPHEN 5-325 MG PO TABS
1.0000 | ORAL_TABLET | Freq: Once | ORAL | Status: AC
  Administered 2020-06-07: 1 via ORAL
  Filled 2020-06-07: qty 1

## 2020-06-07 MED ORDER — LIDOCAINE 5 % EX PTCH: 1.0000 | MEDICATED_PATCH | CUTANEOUS | 0 refills | Status: AC

## 2020-06-07 NOTE — ED Notes (Signed)
Pt resting in bed states left ring finger pain is 7/10 and his right knee is 4/10  Given PO pain medications, pt visitor state they will be able to drive pt home

## 2020-06-07 NOTE — ED Notes (Signed)
Patient given discharge paperwork and instructions. Verbalized understanding of teaching. No IV access. Ambulatory to exit in NAD with steady gait. 
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
## 2020-06-07 NOTE — Discharge Instructions (Signed)
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
## 2021-05-04 IMAGING — US US SCROTUM W/ DOPPLER COMPLETE
1 series · 14 of 25 positions shown · non-contrast
Comparison: None.

CLINICAL DATA: Left testicular swelling and pain for the past 3
days.

EXAM:
SCROTAL ULTRASOUND
DOPPLER ULTRASOUND OF THE TESTICLES
TECHNIQUE: Complete ultrasound examination of the testicles, epididymis, and
other scrotal structures was performed. Color and spectral Doppler
ultrasound were also utilized to evaluate blood flow to the
testicles.

[Series 1: us scrotum w/ doppler complete · 14 of 56 slices shown]
[im 1/56]
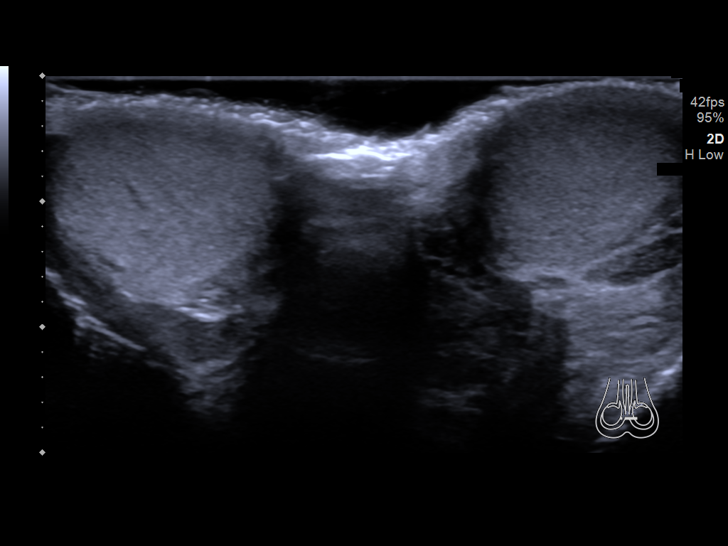
[im 5/56]
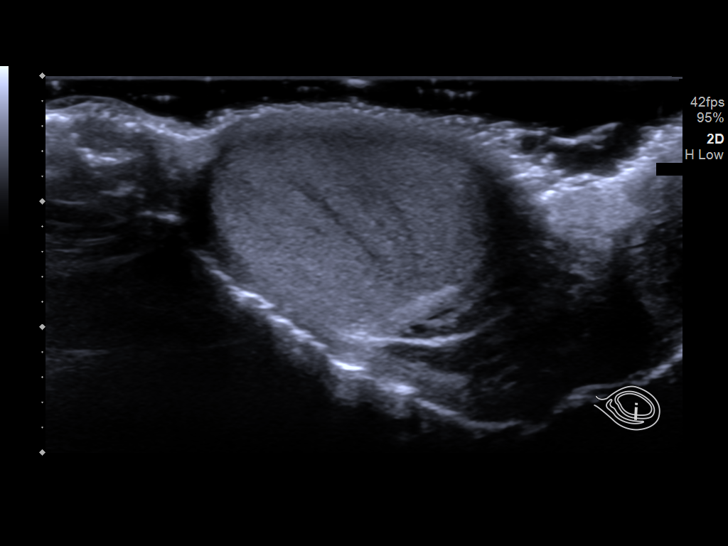
[im 10/56]
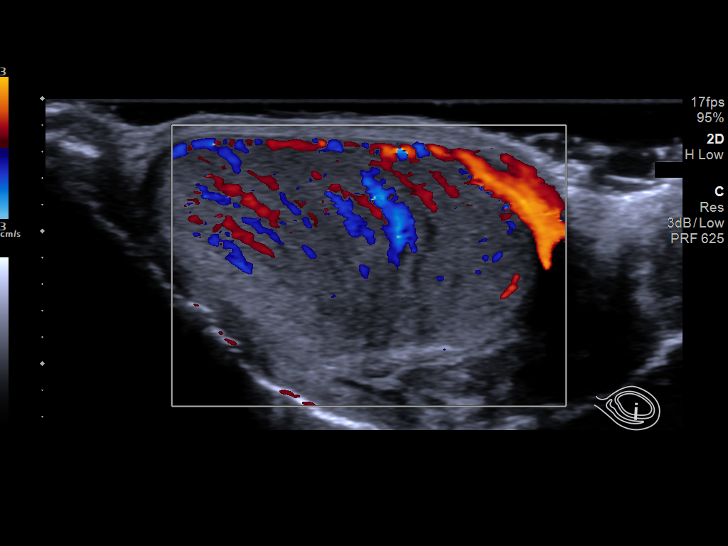
[im 14/56]
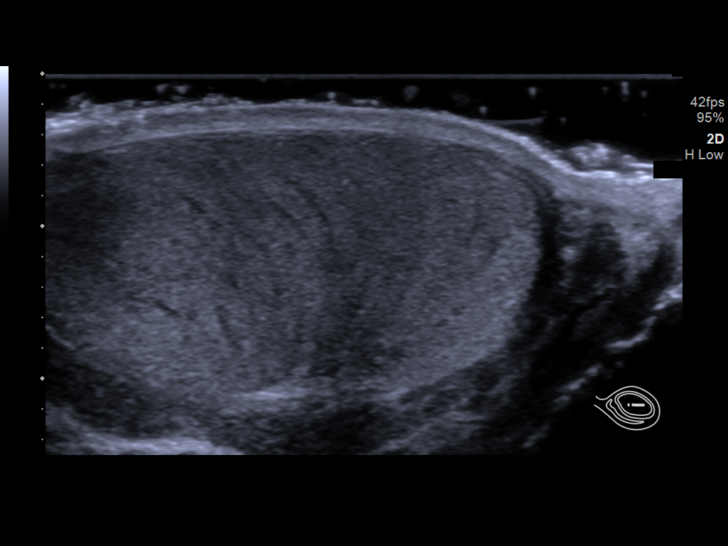
[im 19/56]
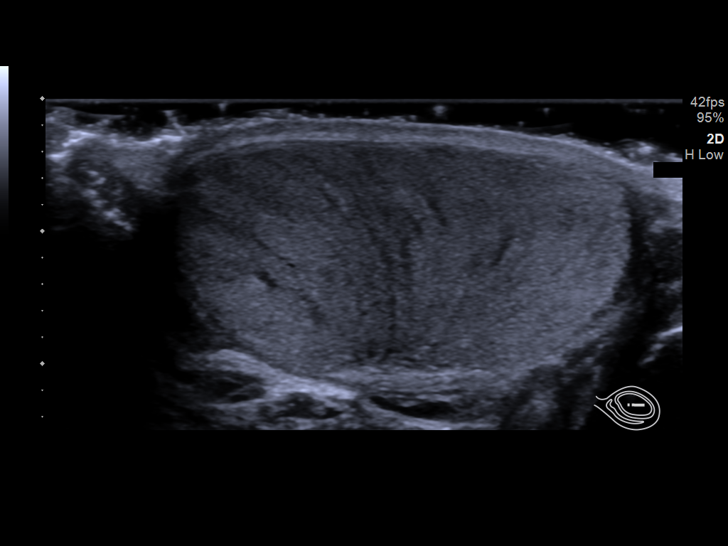
[im 21/56]
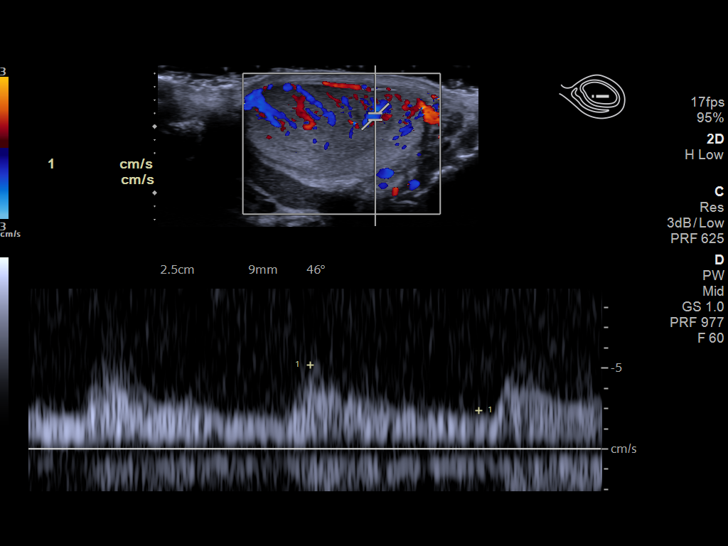
[im 26/56]
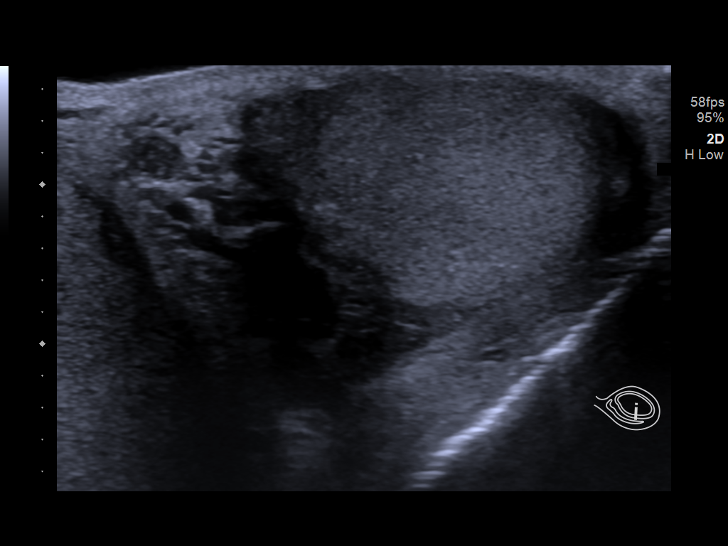
[im 30/56]
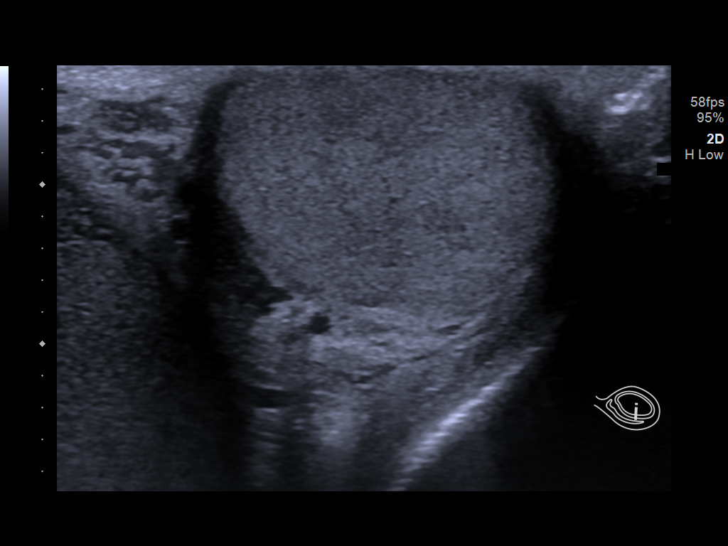
[im 35/56]
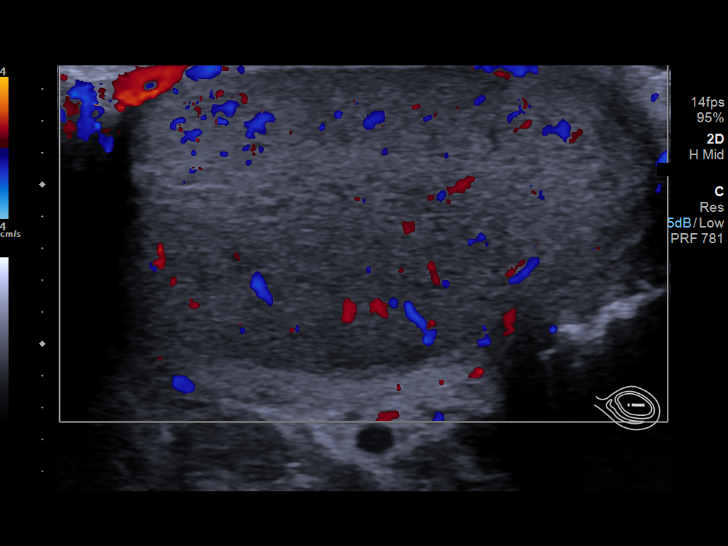
[im 37/56]
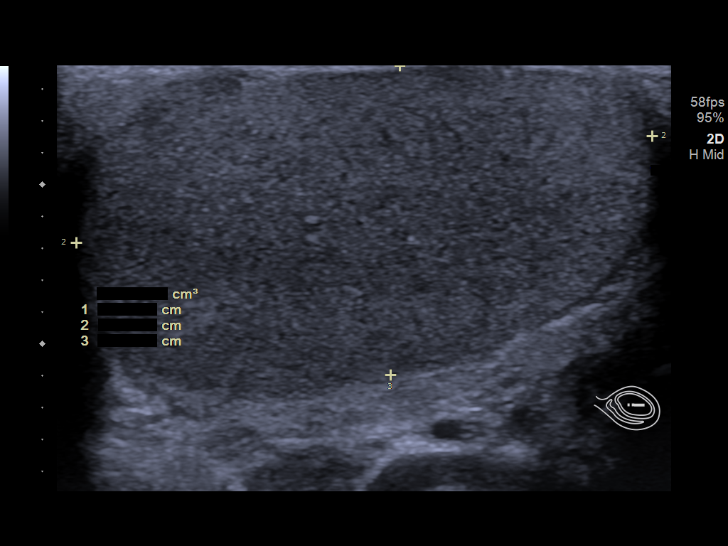
[im 42/56]
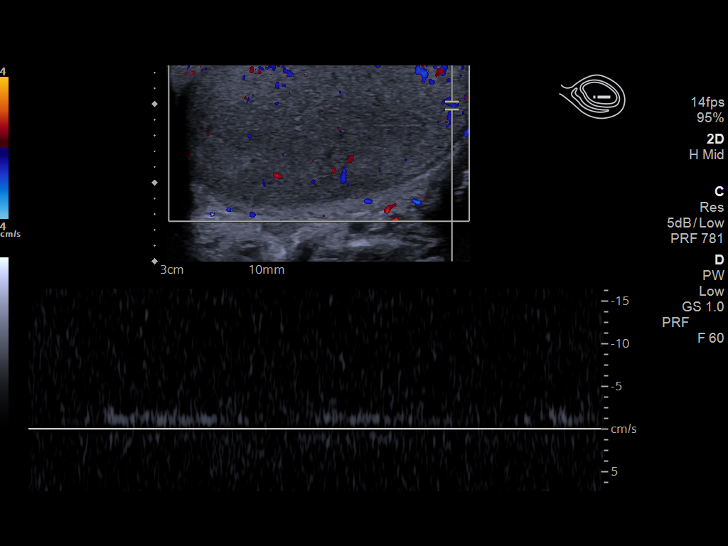
[im 46/56]
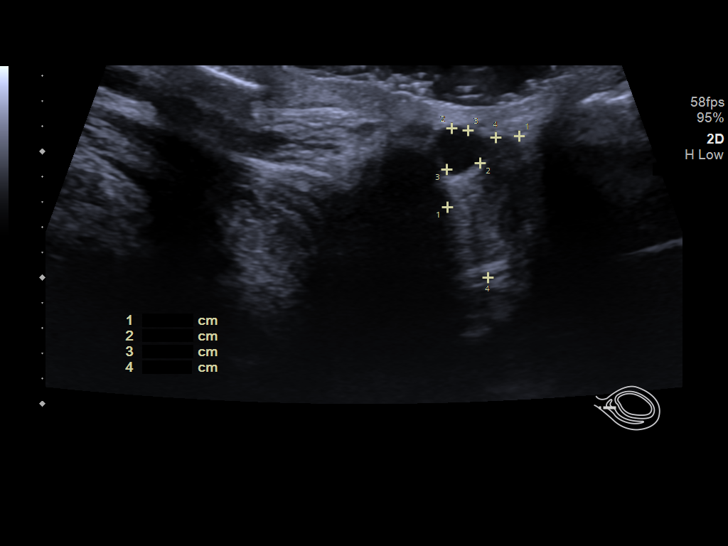
[im 51/56]
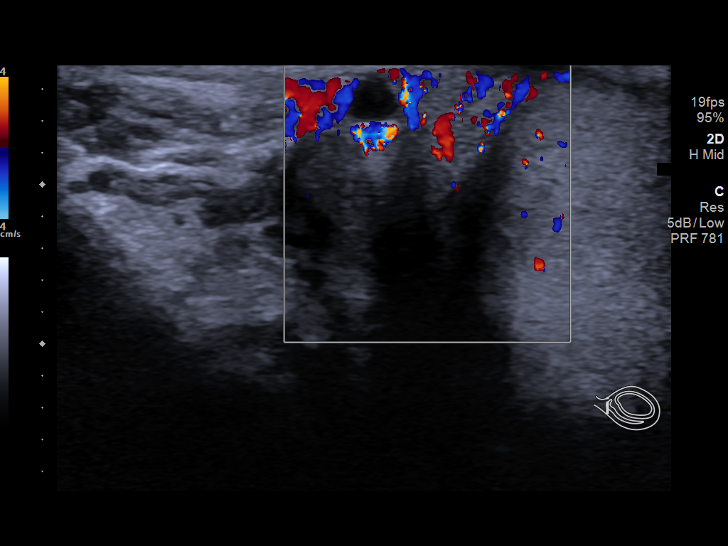
[im 56/56]
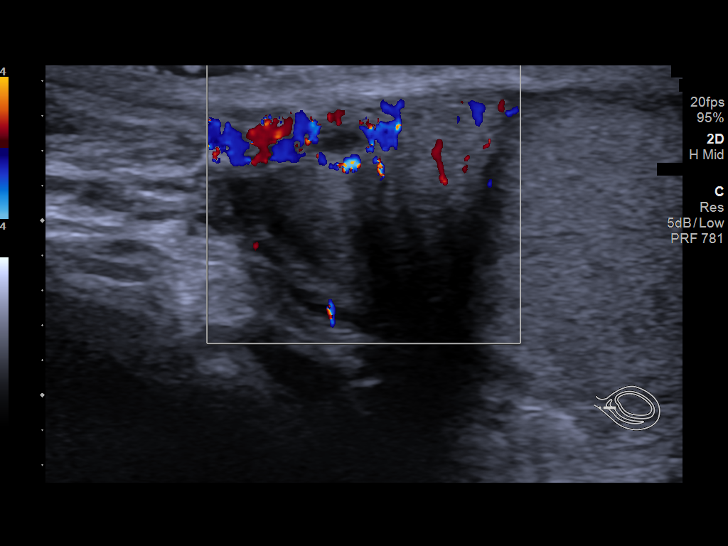

[14 of 25 positions shown; findings below may reference images not displayed]

FINDINGS: Right testicle

Measurements: 3.5 x 1.8 x 2.7 cm. Mild parenchymal heterogeneity
with increased flow. No mass or microlithiasis visualized.

Left testicle

Measurements: 3.7 x 1.9 x 2.9 cm. No mass or microlithiasis
visualized.

Right epididymis: Normal in size and appearance. 6 mm epididymal
head cyst.

Left epididymis: Normal in size and appearance. 3 mm epididymal head
cyst.

Hydrocele:  None visualized.

Varicocele:  Left varicocele.

Pulsed Doppler interrogation of both testes demonstrates normal low
resistance arterial and venous waveforms bilaterally.
IMPRESSION: 1. Mild parenchymal heterogeneity and increased flow of the RIGHT
testicle, potentially reflecting orchitis.
2. Left varicocele.

## 2021-11-13 ENCOUNTER — Emergency Department (HOSPITAL_COMMUNITY)
Admission: EM | Admit: 2021-11-13 | Discharge: 2021-11-13 | Disposition: A | Discharge status: Home / Self Care | Payer: Self-pay | Attending: Emergency Medicine | Admitting: Emergency Medicine

## 2021-11-13 ENCOUNTER — Emergency Department (HOSPITAL_COMMUNITY): Payer: Self-pay

## 2021-11-13 ENCOUNTER — Other Ambulatory Visit: Payer: Self-pay

## 2021-11-13 ENCOUNTER — Encounter (HOSPITAL_COMMUNITY): Payer: Self-pay | Admitting: *Deleted

## 2021-11-13 DIAGNOSIS — R590 Localized enlarged lymph nodes: Secondary | ICD-10-CM | POA: Insufficient documentation

## 2021-11-13 DIAGNOSIS — R0989 Other specified symptoms and signs involving the circulatory and respiratory systems: Secondary | ICD-10-CM

## 2021-11-13 DIAGNOSIS — R0789 Other chest pain: Secondary | ICD-10-CM | POA: Insufficient documentation

## 2021-11-13 LAB — CBC WITH DIFFERENTIAL/PLATELET
Abs Immature Granulocytes: 0.04 10*3/uL (ref 0.00–0.07)
Basophils Absolute: 0 10*3/uL (ref 0.0–0.1)
Basophils Relative: 0 %
Eosinophils Absolute: 0.1 10*3/uL (ref 0.0–0.5)
Eosinophils Relative: 2 %
HCT: 40.7 % (ref 39.0–52.0)
Hemoglobin: 13.4 g/dL (ref 13.0–17.0)
Immature Granulocytes: 1 %
Lymphocytes Relative: 38 %
Lymphs Abs: 3.1 10*3/uL (ref 0.7–4.0)
MCH: 33.6 pg (ref 26.0–34.0)
MCHC: 32.9 g/dL (ref 30.0–36.0)
MCV: 102 fL — ABNORMAL HIGH (ref 80.0–100.0)
Monocytes Absolute: 0.5 10*3/uL (ref 0.1–1.0)
Monocytes Relative: 6 %
Neutro Abs: 4.5 10*3/uL (ref 1.7–7.7)
Neutrophils Relative %: 53 %
Platelets: 261 10*3/uL (ref 150–400)
RBC: 3.99 MIL/uL — ABNORMAL LOW (ref 4.22–5.81)
RDW: 11.6 % (ref 11.5–15.5)
WBC: 8.2 10*3/uL (ref 4.0–10.5)
nRBC: 0 % (ref 0.0–0.2)

## 2021-11-13 LAB — COMPREHENSIVE METABOLIC PANEL
ALT: 17 U/L (ref 0–44)
AST: 24 U/L (ref 15–41)
Albumin: 4.2 g/dL (ref 3.5–5.0)
Alkaline Phosphatase: 53 U/L (ref 38–126)
Anion gap: 7 (ref 5–15)
BUN: 20 mg/dL (ref 6–20)
CO2: 25 mmol/L (ref 22–32)
Calcium: 9 mg/dL (ref 8.9–10.3)
Chloride: 108 mmol/L (ref 98–111)
Creatinine, Ser: 0.91 mg/dL (ref 0.61–1.24)
GFR, Estimated: >60 mL/min (ref >60)
Glucose, Bld: 87 mg/dL (ref 70–99)
Potassium: 4.1 mmol/L (ref 3.5–5.1)
Sodium: 140 mmol/L (ref 135–145)
Total Bilirubin: 0.9 mg/dL (ref 0.3–1.2)
Total Protein: 6.8 g/dL (ref 6.5–8.1)

## 2021-11-13 LAB — TROPONIN I (HIGH SENSITIVITY): Troponin I (High Sensitivity): 3 ng/L (ref <18)

## 2021-11-13 NOTE — ED Triage Notes (Signed)
C/p chest pain for several years , states his mother noticed lump on his chest 2 days ago ,  c/o feeling tired.

## 2021-11-13 NOTE — Discharge Instructions (Addendum)
You were seen here in the ER for evaluation of your chest pain and small bump on your chest. Your labs and imaging were unremarkable. Please follow up with a primary care provider for further evaluation of this bump. If you have any concerns, new or worsening symptoms, please return to the ER for evaluation.   Contact a doctor if: Your chest pain does not go away. You feel depressed. You have a fever. Get help right away if: Your chest pain is worse. You have a cough that gets worse, or you cough up blood. You have very bad (severe) pain in your belly (abdomen). You pass out (faint). You have either of these for no clear reason: Sudden chest discomfort. Sudden discomfort in your arms, back, neck, or jaw. You have shortness of breath at any time. You suddenly start to sweat, or your skin gets clammy. You feel sick to your stomach (nauseous). You throw up (vomit). You suddenly feel lightheaded or dizzy. You feel very weak or tired. Your heart starts to beat fast, or it feels like it is skipping beats. These symptoms may be an emergency. Do not wait to see if the symptoms will go away. Get medical help right away. Call your local emergency services (911 in the [place] Do not drive yourself to the hospital.

## 2021-11-13 NOTE — ED Provider Notes (Signed)
[facility] EMERGENCY DEPARTMENT Provider Note   CSN: [id number] Arrival date & time: 11/13/21  4098     History Chief Complaint  Patient presents with   Chest Pain    Jeremy Zhang is a 26 y.o. male otherwise healthy presents to the ED for evaluation of chest pain off and on for the past few years.  Denies any worsening of this.  Denies any shortness of breath.  He came in today because he noticed a small lump on his left sided chest a few months ago, but was scared of it and did not want to seek evaluation till today.  He reports it was bigger but has not become smaller.  He denies any fevers, weight loss, night sweats.  He denies any shortness of breath or palpitations.  Denies any recent cough or cold symptoms.  He is a daily marijuana smoker.   Chest Pain Associated symptoms: no abdominal pain, no fever, no nausea, no palpitations, no shortness of breath and no vomiting        Home Medications Prior to Admission medications   Medication Sig Start Date End Date Taking? Authorizing Provider  ibuprofen (ADVIL) 600 MG tablet Take 1 tablet (600 mg total) by mouth every 6 (six) hours as needed. Patient not taking: Reported on 07/15/2019 04/29/19   McDonald, [name], [name]  lidocaine (LIDODERM) 5 % Place 1 patch onto the skin daily. Remove & Discard patch within 12 hours or as directed by MD 06/07/20   Joy, Shawn C, [name]  methocarbamol (ROBAXIN) 500 MG tablet Take 1 tablet (500 mg total) by mouth 2 (two) times daily. 06/07/20   Joy, Shawn C, [name]  naproxen (NAPROSYN) 375 MG tablet Take 1 tablet (375 mg total) by mouth 2 (two) times daily as needed. 07/15/19   Maxwell Caul, [name]  diphenhydrAMINE (BENADRYL) 25 mg capsule Take 1 capsule (25 mg total) by mouth every 8 (eight) hours as needed for itching or allergies. Patient not taking: Reported on 02/25/2019 01/26/14 04/29/19  Earley Favor, NP      Allergies    Patient has no known allergies.    Review of Systems   Review  of Systems  Constitutional:  Negative for chills and fever.  Respiratory:  Negative for shortness of breath.   Cardiovascular:  Positive for chest pain. Negative for palpitations.  Gastrointestinal:  Negative for abdominal pain, nausea and vomiting.    Physical Exam Updated Vital Signs BP 125/61 (BP Location: Right Arm)   Pulse (!) 47   Temp 98.1 F (36.7 C) (Oral)   Resp 17   Ht 5\' 9"  (1.753 m)   Wt 68 kg   SpO2 100%   BMI 22.15 kg/m  Physical Exam Vitals reviewed.  Constitutional:      General: He is not in acute distress.    Appearance: Normal appearance. He is not ill-appearing or toxic-appearing.  HENT:     Head: Normocephalic and atraumatic.  Eyes:     General: No scleral icterus. Cardiovascular:     Rate and Rhythm: Normal rate and regular rhythm.  Pulmonary:     Effort: Pulmonary effort is normal.     Breath sounds: Normal breath sounds. No decreased breath sounds or wheezing.  Chest:     Chest wall: Tenderness present.     Comments: Some mild diffuse tenderness palpation to the anterior chest.  Around the 4 to 5 o'clock position of the left pec, there is a small, pea-sized lymph node  that is mobile.  No overlying skin changes noted.  No erythema or warmth. Abdominal:     General: Abdomen is flat.  Musculoskeletal:        General: No deformity.     Cervical back: Normal range of motion.  Skin:    General: Skin is warm and dry.  Neurological:     General: No focal deficit present.     Mental Status: He is alert. Mental status is at baseline.     ED Results / Procedures / Treatments   Labs (all labs ordered are listed, but only abnormal results are displayed) Labs Reviewed  CBC WITH DIFFERENTIAL/PLATELET - Abnormal; Notable for the following components:      Result Value   RBC 3.99 (*)    MCV 102.0 (*)    All other components within normal limits  COMPREHENSIVE METABOLIC PANEL  TROPONIN I (HIGH SENSITIVITY)    EKG EKG  Interpretation  Date/Time:  Monday November 13 2021 07:03:17 EDT Ventricular Rate:  51 PR Interval:  174 QRS Duration: 94 QT Interval:  386 QTC Calculation: 355 R Axis:   81 Text Interpretation: Sinus bradycardia ST elevation, consider early repolarization Abnormal ECG Appears similar to prior Confirmed by Alvino Blood ([id number]) on 11/13/2021 1:10:01 PM  Radiology DG Chest 2 View  Result Date: 11/13/2021 CLINICAL DATA:  Chest pain. EXAM: CHEST - 2 VIEW COMPARISON:  Chest x-ray Jun 07, 2020. FINDINGS: The heart size and mediastinal contours are within normal limits. Both lungs are clear. No visible pleural effusions or pneumothorax. No acute osseous abnormality. IMPRESSION: No active cardiopulmonary disease. Electronically Signed   By: Feliberto Harts M.D.   On: 11/13/2021 09:42    Procedures Procedures   Medications Ordered in ED Medications - No data to display  ED Course/ Medical Decision Making/ A&P                           Medical Decision Making Amount and/or Complexity of Data Reviewed Labs: ordered.   26 year old male presents to the emergency room for evaluation of years of chest pain and a few months of a lymph node on his left chest.  Differential diagnosis includes but not limited to lymphadenopathy, lymphadenitis, PE, ACS, arrhythmia, dissection, aneurysm, malignancy, pneumonia, pneumothorax.  Vital signs are unremarkable other than some bradycardia which is consistent for patient's baseline.  Physical exam as noted above.  Given the patient's chest pain, I doubt this is any acute given its been off and on for the past few years.  Will order troponin and EKG and chest x-ray.  Will order CBC with differential to rule out any atypical cells.  I independently reviewed and interpreted the patient's labs.  CBC without cytosis or anemia.  CMP unremarkable for any electrolyte or LFT abnormalities.  Troponin at 3.  EKG reviewed and interpreted by my attending.  Shows early  repole but appears similar to prior.  Chest x-ray shows no active cardiopulmonary disease.  Given the patient's very small lymph node that is mobile, we will have the patient follow-up with his primary care doctor for further surveillance.  I do not see any additional testing that needs to be done given this is been present for months and he does not have any B symptoms.  I discussed the with the patient his lab and imaging results.  Discussed that he needs a follow-up with his primary care doctor for surveillance of the lymph node.  We discussed  return precautions and red flag symptoms.  He verbalizes understanding and agrees to the plan. He is asking for a work note.  Patient is stable and being discharged home in good condition.  Final Clinical Impression(s) / ED Diagnoses Final diagnoses:  Chest wall pain  Firm lymph node    Rx / DC Orders ED Discharge Orders     None         Sherrell Puller, [name] 11/16/21 0786    Cristie Hem, MD 11/18/21 [phone]
